# Patient Record
Sex: Female | Born: 1980 | Race: White | Hispanic: Yes | Marital: Married | Smoking: Never smoker
Health system: Southern US, Community
[De-identification: ages and names within clinical notes are randomized; demographics above are authoritative.]

## PROBLEM LIST (undated history)

## (undated) ENCOUNTER — Inpatient Hospital Stay (HOSPITAL_COMMUNITY): Payer: Self-pay

## (undated) DIAGNOSIS — O209 Hemorrhage in early pregnancy, unspecified: Secondary | ICD-10-CM

## (undated) DIAGNOSIS — O4292 Full-term premature rupture of membranes, unspecified as to length of time between rupture and onset of labor: Secondary | ICD-10-CM

## (undated) DIAGNOSIS — D62 Acute posthemorrhagic anemia: Secondary | ICD-10-CM

## (undated) DIAGNOSIS — R011 Cardiac murmur, unspecified: Secondary | ICD-10-CM

## (undated) DIAGNOSIS — J329 Chronic sinusitis, unspecified: Secondary | ICD-10-CM

## (undated) HISTORY — PX: OTHER SURGICAL HISTORY: SHX169

## (undated) HISTORY — PX: WISDOM TOOTH EXTRACTION: SHX21

## (undated) HISTORY — PX: MOUTH SURGERY: SHX715

## (undated) HISTORY — PX: NASAL POLYP SURGERY: SHX186

---

## 2014-08-04 ENCOUNTER — Inpatient Hospital Stay (HOSPITAL_COMMUNITY): Payer: Managed Care, Other (non HMO)

## 2014-08-04 ENCOUNTER — Inpatient Hospital Stay (HOSPITAL_COMMUNITY)
Admission: AD | Admit: 2014-08-04 | Discharge: 2014-08-04 | Disposition: A | Discharge status: Home / Self Care | Payer: Managed Care, Other (non HMO) | Source: Ambulatory Visit | Attending: Obstetrics & Gynecology | Admitting: Obstetrics & Gynecology

## 2014-08-04 ENCOUNTER — Encounter (HOSPITAL_COMMUNITY): Payer: Self-pay | Admitting: *Deleted

## 2014-08-04 DIAGNOSIS — O039 Complete or unspecified spontaneous abortion without complication: Secondary | ICD-10-CM | POA: Diagnosis not present

## 2014-08-04 DIAGNOSIS — R109 Unspecified abdominal pain: Secondary | ICD-10-CM | POA: Insufficient documentation

## 2014-08-04 DIAGNOSIS — O209 Hemorrhage in early pregnancy, unspecified: Secondary | ICD-10-CM | POA: Diagnosis present

## 2014-08-04 HISTORY — DX: Chronic sinusitis, unspecified: J32.9

## 2014-08-04 LAB — CBC
HCT: 37.7 % (ref 36.0–46.0)
Hemoglobin: 13.4 g/dL (ref 12.0–15.0)
MCH: 33.3 pg (ref 26.0–34.0)
MCHC: 35.5 g/dL (ref 30.0–36.0)
MCV: 93.8 fL (ref 78.0–100.0)
PLATELETS: 191 10*3/uL (ref 150–400)
RBC: 4.02 MIL/uL (ref 3.87–5.11)
RDW: 12.5 % (ref 11.5–15.5)
WBC: 9.9 10*3/uL (ref 4.0–10.5)

## 2014-08-04 LAB — URINALYSIS, ROUTINE W REFLEX MICROSCOPIC
BILIRUBIN URINE: NEGATIVE
Glucose, UA: NEGATIVE mg/dL
Ketones, ur: NEGATIVE mg/dL
Leukocytes, UA: NEGATIVE
NITRITE: NEGATIVE
Protein, ur: NEGATIVE mg/dL
SPECIFIC GRAVITY, URINE: 1.01 (ref 1.005–1.030)
UROBILINOGEN UA: 0.2 mg/dL (ref 0.0–1.0)
pH: 7.5 (ref 5.0–8.0)

## 2014-08-04 LAB — URINE MICROSCOPIC-ADD ON

## 2014-08-04 LAB — WET PREP, GENITAL
Clue Cells Wet Prep HPF POC: NONE SEEN
Trich, Wet Prep: NONE SEEN
Yeast Wet Prep HPF POC: NONE SEEN

## 2014-08-04 LAB — POCT PREGNANCY, URINE: Preg Test, Ur: POSITIVE — AB

## 2014-08-04 LAB — HCG, QUANTITATIVE, PREGNANCY: HCG, BETA CHAIN, QUANT, S: 13 m[IU]/mL — AB (ref <5)

## 2014-08-04 LAB — ABO/RH: ABO/RH(D): O POS

## 2014-08-04 MED ORDER — METOCLOPRAMIDE HCL 10 MG PO TABS
10.0000 mg | ORAL_TABLET | ORAL | Status: DC
  Filled 2014-08-04: qty 1

## 2014-08-04 MED ORDER — OXYCODONE-ACETAMINOPHEN 5-325 MG PO TABS
1.0000 | ORAL_TABLET | ORAL | Status: AC
  Administered 2014-08-04: 1 via ORAL
  Filled 2014-08-04: qty 1

## 2014-08-04 MED ORDER — OXYCODONE-ACETAMINOPHEN 5-325 MG PO TABS: 1.0000 | ORAL_TABLET | Freq: Four times a day (QID) | ORAL | Status: DC | PRN

## 2014-08-04 NOTE — MAU Provider Note (Signed)
History     CSN: [id number]  Arrival date and time: 08/04/14 [id number]   First Provider Initiated Contact with Patient 08/04/14 [phone]      Chief Complaint  Patient presents with  . Vaginal Bleeding   HPI Ann Henry 33 y.o.G1P0 @[redacted]w[redacted]d  presents to MAU with cramping and bleeding that began at 7am.  She may have had some bleeding or discharge with cramping during the night.  She had just taken a positive home pregnancy test 3 days ago.  She has some nausea, cramping pain 7-10/10, diarrhea.  She denies vomiting, dysuria, fever.   OB History   Grav Para Term Preterm Abortions TAB SAB Ect Mult Living   1         0      Past Medical History  Diagnosis Date  . Sinus infection     Past Surgical History  Procedure Laterality Date  . Wisdom tooth extraction    . Dental implant      History reviewed. No pertinent family history.  History  Substance Use Topics  . Smoking status: Never Smoker   . Smokeless tobacco: Never Used  . Alcohol Use: Yes     Comment: Occas.    Allergies: No Known Allergies  No prescriptions prior to admission    Review of Systems  Constitutional: Negative for fever, chills and diaphoresis.  HENT: Negative for congestion and sore throat.   Eyes: Negative for blurred vision and double vision.  Respiratory: Negative for cough, shortness of breath and wheezing.   Cardiovascular: Negative for chest pain and palpitations.  Gastrointestinal: Positive for nausea, abdominal pain and diarrhea. Negative for heartburn, vomiting and constipation.  Genitourinary: Negative for dysuria, urgency and frequency.  Skin: Negative for itching and rash.  Neurological: Positive for dizziness and headaches. Negative for weakness.   Physical Exam   Blood pressure 125/74, pulse 71, temperature 98.1 F (36.7 C), temperature source Oral, resp. rate 18, height 5\' 7"  (1.702 m), weight 60.782 kg (134 lb), last menstrual period 07/01/2014.  Physical Exam  Constitutional: She is  oriented to person, place, and time. She appears well-developed and well-nourished. No distress.  HENT:  Head: Normocephalic and atraumatic.  Eyes: EOM are normal.  Neck: Normal range of motion.  Cardiovascular: Normal rate and regular rhythm.   Respiratory: Effort normal and breath sounds normal. No respiratory distress.  GI: Soft. Bowel sounds are normal. She exhibits no distension. There is no tenderness.  Genitourinary:  Vaginal vault full of bright red blood (despite no bleeding on pad).  Very small clots noted.   Cervix closed Adnexa nontender No CMT  Musculoskeletal: Normal range of motion. She exhibits no edema.  Neurological: She is alert and oriented to person, place, and time.  Skin: Skin is warm and dry.  Psychiatric: She has a normal mood and affect.   Results for orders placed during the hospital encounter of 08/04/14 (from the past 24 hour(s))  URINALYSIS, ROUTINE W REFLEX MICROSCOPIC     Status: Abnormal   Collection Time    08/04/14  9:22 AM      Result Value Ref Range   Color, Urine YELLOW  YELLOW   APPearance CLEAR  CLEAR   Specific Gravity, Urine 1.010  1.005 - 1.030   pH 7.5  5.0 - 8.0   Glucose, UA NEGATIVE  NEGATIVE mg/dL   Hgb urine dipstick MODERATE (*) NEGATIVE   Bilirubin Urine NEGATIVE  NEGATIVE   Ketones, ur NEGATIVE  NEGATIVE mg/dL   Protein,  ur NEGATIVE  NEGATIVE mg/dL   Urobilinogen, UA 0.2  0.0 - 1.0 mg/dL   Nitrite NEGATIVE  NEGATIVE   Leukocytes, UA NEGATIVE  NEGATIVE  URINE MICROSCOPIC-ADD ON     Status: Abnormal   Collection Time    08/04/14  9:22 AM      Result Value Ref Range   Squamous Epithelial / LPF FEW (*) RARE   RBC / HPF 3-6  <3 RBC/hpf   Bacteria, UA FEW (*) RARE   Urine-Other MUCOUS PRESENT    POCT PREGNANCY, URINE     Status: Abnormal   Collection Time    08/04/14  9:37 AM      Result Value Ref Range   Preg Test, Ur POSITIVE (*) NEGATIVE  CBC     Status: None   Collection Time    08/04/14 10:05 AM      Result Value  Ref Range   WBC 9.9  4.0 - 10.5 K/uL   RBC 4.02  3.87 - 5.11 MIL/uL   Hemoglobin 13.4  12.0 - 15.0 g/dL   HCT 81.1  91.4 - 78.2 %   MCV 93.8  78.0 - 100.0 fL   MCH 33.3  26.0 - 34.0 pg   MCHC 35.5  30.0 - 36.0 g/dL   RDW 95.6  21.3 - 08.6 %   Platelets 191  150 - 400 K/uL  ABO/RH     Status: None   Collection Time    08/04/14 10:05 AM      Result Value Ref Range   ABO/RH(D) O POS    HCG, QUANTITATIVE, PREGNANCY     Status: Abnormal   Collection Time    08/04/14 10:05 AM      Result Value Ref Range   hCG, Beta Chain, Quant, S 13 (*) <5 mIU/mL  WET PREP, GENITAL     Status: Abnormal   Collection Time    08/04/14 10:36 AM      Result Value Ref Range   Yeast Wet Prep HPF POC NONE SEEN  NONE SEEN   Trich, Wet Prep NONE SEEN  NONE SEEN   Clue Cells Wet Prep HPF POC NONE SEEN  NONE SEEN   WBC, Wet Prep HPF POC FEW (*) NONE SEEN    MAU Course  Procedures none  MDM U/S neg for IUP: likely recent SAB  Assessment and Plan  Assessment: SAB  Plan: Discharge to home Follow up Quant in clinic in 48 hours Return to MAU for emergencies: increased pain or bleeding Percocet for pain control Nausea medication declined  Bertram Denver 08/04/2014, 9:50 AM

## 2014-08-04 NOTE — Discharge Instructions (Signed)
Miscarriage A miscarriage is the sudden loss of an unborn baby (fetus) before the 20th week of pregnancy. Most miscarriages happen in the first 3 months of pregnancy. Sometimes, it happens before a woman even knows she is pregnant. A miscarriage is also called a "spontaneous miscarriage" or "early pregnancy loss." Having a miscarriage can be an emotional experience. Talk with your caregiver about any questions you may have about miscarrying, the grieving process, and your future pregnancy plans. CAUSES   Problems with the fetal chromosomes that make it impossible for the baby to develop normally. Problems with the baby's genes or chromosomes are most often the result of errors that occur, by chance, as the embryo divides and grows. The problems are not inherited from the parents.  Infection of the cervix or uterus.   Hormone problems.   Problems with the cervix, such as having an incompetent cervix. This is when the tissue in the cervix is not strong enough to hold the pregnancy.   Problems with the uterus, such as an abnormally shaped uterus, uterine fibroids, or congenital abnormalities.   Certain medical conditions.   Smoking, drinking alcohol, or taking illegal drugs.   Trauma.  Often, the cause of a miscarriage is unknown.  SYMPTOMS   Vaginal bleeding or spotting, with or without cramps or pain.  Pain or cramping in the abdomen or lower back.  Passing fluid, tissue, or blood clots from the vagina. DIAGNOSIS  Your caregiver will perform a physical exam. You may also have an ultrasound to confirm the miscarriage. Blood or urine tests may also be ordered. TREATMENT   Sometimes, treatment is not necessary if you naturally pass all the fetal tissue that was in the uterus. If some of the fetus or placenta remains in the body (incomplete miscarriage), tissue left behind may become infected and must be removed. Usually, a dilation and curettage (D and C) procedure is performed.  During a D and C procedure, the cervix is widened (dilated) and any remaining fetal or placental tissue is gently removed from the uterus.  Antibiotic medicines are prescribed if there is an infection. Other medicines may be given to reduce the size of the uterus (contract) if there is a lot of bleeding.  If you have Rh negative blood and your baby was Rh positive, you will need a Rh immunoglobulin shot. This shot will protect any future baby from having Rh blood problems in future pregnancies. HOME CARE INSTRUCTIONS   Your caregiver may order bed rest or may allow you to continue light activity. Resume activity as directed by your caregiver.  Have someone help with home and family responsibilities during this time.   Keep track of the number of sanitary pads you use each day and how soaked (saturated) they are. Write down this information.   Do not use tampons. Do not douche or have sexual intercourse until approved by your caregiver.   Only take over-the-counter or prescription medicines for pain or discomfort as directed by your caregiver.   Do not take aspirin. Aspirin can cause bleeding.   Keep all follow-up appointments with your caregiver.   If you or your partner have problems with grieving, talk to your caregiver or seek counseling to help cope with the pregnancy loss. Allow enough time to grieve before trying to get pregnant again.  SEEK IMMEDIATE MEDICAL CARE IF:   You have severe cramps or pain in your back or abdomen.  You have a fever.  You pass large blood clots (walnut-sized   or larger) ortissue from your vagina. Save any tissue for your caregiver to inspect.   Your bleeding increases.   You have a thick, bad-smelling vaginal discharge.  You become lightheaded, weak, or you faint.   You have chills.  MAKE SURE YOU:  Understand these instructions.  Will watch your condition.  Will get help right away if you are not doing well or get  worse. Document Released: 05/30/2001 Document Revised: 03/31/2013 Document Reviewed: 01/23/2012 ExitCare Patient Information 2015 [employer]. This information is not intended to replace advice given to you by your health care provider. Make sure you discuss any questions you have with your health care provider.  

## 2014-08-04 NOTE — Progress Notes (Signed)
Was feeling nauseated. Asked PA for order for medication. Patient declines at this time. States she is feeling better right now. [place] given

## 2014-08-04 NOTE — MAU Note (Signed)
Had + HPT this weekend. Had some spotting during the night. Started cramping and bleeding became heavier. Scant amount blood noted on perineum right now, but no active bleeding.

## 2014-08-05 LAB — GC/CHLAMYDIA PROBE AMP
CT Probe RNA: NEGATIVE
GC Probe RNA: NEGATIVE

## 2014-08-06 ENCOUNTER — Inpatient Hospital Stay (HOSPITAL_COMMUNITY)
Admission: AD | Admit: 2014-08-06 | Discharge: 2014-08-06 | Disposition: A | Discharge status: Home / Self Care | Payer: Managed Care, Other (non HMO) | Source: Ambulatory Visit | Attending: Family Medicine | Admitting: Family Medicine

## 2014-08-06 DIAGNOSIS — O039 Complete or unspecified spontaneous abortion without complication: Secondary | ICD-10-CM | POA: Insufficient documentation

## 2014-08-06 LAB — HCG, QUANTITATIVE, PREGNANCY: HCG, BETA CHAIN, QUANT, S: 4 m[IU]/mL (ref <5)

## 2014-08-06 NOTE — MAU Note (Signed)
Feeling better. Is still bleeding, like a period. Pain is better

## 2014-08-06 NOTE — MAU Provider Note (Signed)
  History     CSN: [id number]  Arrival date and time: 08/06/14 [id number]   First Provider Initiated Contact with Patient 08/06/14 [phone]      Chief Complaint  Patient presents with  . Follow-up   HPI Comments: Ann Henry 33 y.o. G1P0 2972w1d presents to MAU for follow up with [facility]. She is bleeding like she is on her menses and has no pain. She is planning another pregnancy soon and would like to be seen in [place].     Past Medical History  Diagnosis Date  . Sinus infection     Past Surgical History  Procedure Laterality Date  . Wisdom tooth extraction    . Dental implant      No family history on file.  History  Substance Use Topics  . Smoking status: Never Smoker   . Smokeless tobacco: Never Used  . Alcohol Use: Yes     Comment: Occas.    Allergies: No Known Allergies  No prescriptions prior to admission    Review of Systems  Constitutional: Negative.   HENT: Negative.   Eyes: Negative.   Cardiovascular: Negative.   Genitourinary: Negative.        Vaginal bleeding  Musculoskeletal: Negative.   Skin: Negative.   Neurological: Negative.   Psychiatric/Behavioral: Negative.    Physical Exam   Blood pressure 118/77, pulse 63, temperature 98.5 F (36.9 C), temperature source Oral, resp. rate 16, last menstrual period 07/01/2014.  Physical Exam  Constitutional: She is oriented to person, place, and time. She appears well-developed and well-nourished.  HENT:  Head: Normocephalic and atraumatic.  Eyes: Pupils are equal, round, and reactive to light.  Musculoskeletal: Normal range of motion.  Neurological: She is alert and oriented to person, place, and time.  Skin: Skin is warm.  Psychiatric: She has a normal mood and affect. Her behavior is normal. Judgment and thought content normal.    MAU Course  Procedures  MDM  Lab Results  Component Value Date   HCGBETAQNT 4 08/06/2014   HCGBETAQNT 13* 08/04/2014      Assessment and Plan   A: SAB  P:  Advised to avoid pregnancy for 3 cycles Given list of providers in [place] Follow up as needed  Carolynn ServeBarefoot, Nilah Belcourt Miller 08/06/2014, 3:01 PM

## 2014-08-06 NOTE — Discharge Instructions (Signed)
Miscarriage °A miscarriage is the loss of an unborn baby (fetus) before the 20th week of pregnancy. The cause is often unknown.  °HOME CARE °· You may need to stay in bed (bed rest), or you may be able to do light activity. Go about activity as told by your doctor. °· Have help at home. °· Write down how many pads you use each day. Write down how soaked they are. °· Do not use tampons. Do not wash out your vagina (douche) or have sex (intercourse) until your doctor approves. °· Only take medicine as told by your doctor. °· Do not take aspirin. °· Keep all doctor visits as told. °· If you or your partner have problems with grieving, talk to your doctor. You can also try counseling. Give yourself time to grieve before trying to get pregnant again. °GET HELP RIGHT AWAY IF: °· You have bad cramps or pain in your back or belly (abdomen). °· You have a fever. °· You pass large clumps of blood (clots) from your vagina that are walnut-sized or larger. Save the clumps for your doctor to see. °· You pass large amounts of tissue from your vagina. Save the tissue for your doctor to see. °· You have more bleeding. °· You have thick, bad-smelling fluid (discharge) coming from the vagina. °· You get lightheaded, weak, or you pass out (faint). °· You have chills. °MAKE SURE YOU: °· Understand these instructions. °· Will watch your condition. °· Will get help right away if you are not doing well or get worse. °Document Released: 02/26/2012 Document Reviewed: 02/26/2012 °ExitCare® Patient Information ©2015 [employer]. This information is not intended to replace advice given to you by your health care provider. Make sure you discuss any questions you have with your health care provider. ° °

## 2014-08-06 NOTE — MAU Provider Note (Signed)
Attestation of Attending Supervision of Advanced Practitioner (PA/CNM/NP): Evaluation and management procedures were performed by the Advanced Practitioner under my supervision and collaboration.  I have reviewed the Advanced Practitioner's note and chart, and I agree with the management and plan.  Reva BoresPRATT,Caidence Kaseman S, MD [facility] Attending 08/06/2014 4:25 PM

## 2014-10-20 ENCOUNTER — Encounter (HOSPITAL_COMMUNITY): Payer: Self-pay | Admitting: *Deleted

## 2014-12-18 NOTE — L&D Delivery Note (Signed)
Delivery Note  First Stage: Labor onset: 2300 (9/30) Augmentation: Pitocin Analgesia Eliezer Lofts intrapartum: epidural, IV Fentanyl SROM at 2300 (9/30)  Second Stage: Complete dilation at 0624 Onset of pushing at 0741 FHR second stage 135, variables with pushing then 90s x3 min, maternal lateral and o2  In maternal lithotomy, delivery of a viable female at 30 by CNM in OA position with restitution to ROT and compound left arm Tight nuchal cord x2, clamped and cut on perineum after delivery of anterior shoulder Cord double clamped after cessation of pulsation, cut by CNM Cord blood sample collected   Collection of cord blood donation n/a Arterial cord blood sample n/a  Third Stage: Placenta delivered via Tomasa Blase intact with 3 VC @ [id number] Placenta disposition: routine disposal Uterine tone firm / bleeding small  Bilateral labial minora laceration and 1st degree perineal laceration identified  Anesthesia for repair: epidural Repaired with 3-0 Vicryl rapide Est. Blood Loss (mL): 447  Complications: Prolonged ROM  Mom to postpartum.  Baby to Couplet care / Skin to Skin.  Newborn: Birth Weight: pending Apgar Scores: 8/9 Feeding planned: breast  Donette Larry, N MSN, CNM 09/19/2015, 9:58 AM

## 2015-01-30 ENCOUNTER — Inpatient Hospital Stay (HOSPITAL_COMMUNITY): Payer: Managed Care, Other (non HMO)

## 2015-01-30 ENCOUNTER — Inpatient Hospital Stay (HOSPITAL_COMMUNITY)
Admission: AD | Admit: 2015-01-30 | Discharge: 2015-01-30 | Disposition: A | Discharge status: Home / Self Care | Payer: Managed Care, Other (non HMO) | Source: Ambulatory Visit | Attending: Obstetrics and Gynecology | Admitting: Obstetrics and Gynecology

## 2015-01-30 ENCOUNTER — Encounter (HOSPITAL_COMMUNITY): Payer: Self-pay

## 2015-01-30 DIAGNOSIS — O200 Threatened abortion: Secondary | ICD-10-CM | POA: Diagnosis not present

## 2015-01-30 DIAGNOSIS — Z3A08 8 weeks gestation of pregnancy: Secondary | ICD-10-CM | POA: Insufficient documentation

## 2015-01-30 DIAGNOSIS — O418X10 Other specified disorders of amniotic fluid and membranes, first trimester, not applicable or unspecified: Secondary | ICD-10-CM

## 2015-01-30 DIAGNOSIS — O209 Hemorrhage in early pregnancy, unspecified: Secondary | ICD-10-CM | POA: Diagnosis present

## 2015-01-30 DIAGNOSIS — O468X1 Other antepartum hemorrhage, first trimester: Secondary | ICD-10-CM

## 2015-01-30 DIAGNOSIS — O208 Other hemorrhage in early pregnancy: Secondary | ICD-10-CM | POA: Insufficient documentation

## 2015-01-30 HISTORY — DX: Hemorrhage in early pregnancy, unspecified: O20.9

## 2015-01-30 LAB — URINALYSIS, ROUTINE W REFLEX MICROSCOPIC
Bilirubin Urine: NEGATIVE
Glucose, UA: NEGATIVE mg/dL
Ketones, ur: NEGATIVE mg/dL
LEUKOCYTES UA: NEGATIVE
Nitrite: NEGATIVE
PH: 6 (ref 5.0–8.0)
Protein, ur: NEGATIVE mg/dL
SPECIFIC GRAVITY, URINE: 1.02 (ref 1.005–1.030)
UROBILINOGEN UA: 0.2 mg/dL (ref 0.0–1.0)

## 2015-01-30 LAB — URINE MICROSCOPIC-ADD ON

## 2015-01-30 LAB — CBC
HCT: 33.9 % — ABNORMAL LOW (ref 36.0–46.0)
Hemoglobin: 12 g/dL (ref 12.0–15.0)
MCH: 32.7 pg (ref 26.0–34.0)
MCHC: 35.4 g/dL (ref 30.0–36.0)
MCV: 92.4 fL (ref 78.0–100.0)
Platelets: 197 10*3/uL (ref 150–400)
RBC: 3.67 MIL/uL — AB (ref 3.87–5.11)
RDW: 12 % (ref 11.5–15.5)
WBC: 10.2 10*3/uL (ref 4.0–10.5)

## 2015-01-30 NOTE — MAU Provider Note (Signed)
History     CSN: [id number]  Arrival date and time: 01/30/15 0055  Provider notified: 0135 & 0245 Provider on unit: 0325 Provider at bedside: 0330    Chief Complaint  Patient presents with  . Vaginal Bleeding   HPI  Ms. Ann Henry is a 34 yo G2P0010 at 8.[redacted] wks gestation presenting with complaints of sudden onset of vaginal bleeding like a period about 250030. Denies any clots. Last sex was last Wednesday.Her blood type is O positive.  Her hx is significant for a MAB 07/2014 - currently taking progesterone vaginally.  Her primary OB provider at [facility] is Dr. Billy Coastaavon.  Past Medical History  Diagnosis Date  . Sinus infection   . Vaginal bleeding in pregnant patient at less than [redacted] weeks gestation 01/30/2015    Past Surgical History  Procedure Laterality Date  . Wisdom tooth extraction    . Dental implant    . Nasal polyp surgery    . Mouth surgery      Family History  Problem Relation Age of Onset  . Hypertension Mother   . Heart disease Father     History  Substance Use Topics  . Smoking status: Never Smoker   . Smokeless tobacco: Never Used  . Alcohol Use: Yes     Comment: Occas. social before preg    Allergies: No Known Allergies  Prescriptions prior to admission  Medication Sig Dispense Refill Last Dose  . Prenatal Vit-Fe Fumarate-FA (PRENATAL MULTIVITAMIN) TABS tablet Take 1 tablet by mouth daily at 12 noon.   01/29/2015 at Unknown time  . PRESCRIPTION MEDICATION PROGESTERONE VAGINALLY   Past Week at Unknown time  . ibuprofen (ADVIL,MOTRIN) 200 MG tablet Take 400 mg by mouth daily as needed for mild pain or moderate pain.   Past Month at Unknown time  . oxyCODONE-acetaminophen (ROXICET) 5-325 MG per tablet Take 1 tablet by mouth every 6 (six) hours as needed for severe pain. 15 tablet 0     Review of Systems  Constitutional: Negative.   HENT: Negative.   Eyes: Negative.   Respiratory: Negative.   Cardiovascular: Negative.   Gastrointestinal: Negative.    Genitourinary:       Vaginal bleeding like a period, but only with wiping in BR; no pain  Musculoskeletal: Negative.   Skin: Negative.   Neurological: Negative.   Endo/Heme/Allergies: Negative.   Psychiatric/Behavioral: Negative.    US Ob Comp Less 14 Wks  01/30/2015   CLINICAL DATA:  Vaginal bleeding in pregnancy, first trimester. Initial encounter.  EXAM: OBSTETRIC <14 WK US AND TRANSVAGINAL OB US  TECHNIQUE: Both transabdominal and transvaginal ultrasound examinations were performed for complete evaluation of the gestation as well as the maternal uterus, adnexal regions, and pelvic cul-de-sac. Transvaginal technique was performed to assess early pregnancy.  COMPARISON:  None.  FINDINGS: Intrauterine gestational sac: Visualized/normal in shape.  Yolk sac:  Visible  Embryo:  Visible  Cardiac Activity: detected  Heart Rate: 170 bpm  CRL:  2.0   mm   8  w   5  d  Maternal uterus/adnexae: Small volume subchorionic hemorrhage seen in the upper endometrium. No pelvic free fluid. Both ovaries appear normal, seen transabdominally.  IMPRESSION: 1.  Single living IUP demonstrated.  2.  Small volume subchorionic hemorrhage.  No pelvic free fluid.   Electronically Signed   By: Odessa FlemingH  Hall M.D.   On: 01/30/2015 02:39    Physical Exam   Blood pressure 114/72, pulse 58, temperature 97.7 F (36.5 C), temperature  source Oral, resp. rate 16, height  (1.676 m), weight 62.37 kg (137 lb 8 oz), last menstrual period 11/30/2014, SpO2 99 %, unknown if currently breastfeeding.  Physical Exam  Constitutional: She is oriented to person, place, and time. She appears well-developed and well-nourished.  Eyes: Pupils are equal, round, and reactive to light.  Neck: Normal range of motion.  Cardiovascular: Normal rate, regular rhythm, normal heart sounds and intact distal pulses.   Respiratory: Effort normal and breath sounds normal.  GI: Soft. Bowel sounds are normal.  Genitourinary: Uterus normal. Vaginal discharge  found.  Scant amount of brown vaginal discharge c/w old blood; gravid uterus; S=D  Musculoskeletal: Normal range of motion.  Neurological: She is alert and oriented to person, place, and time. She has normal reflexes.  Skin: Skin is warm and dry.  Psychiatric: She has a normal mood and affect. Her behavior is normal. Judgment and thought content normal.    MAU Course  Procedures CBC OB U/S <14 wks Pelvic Exam Assessment and Plan  SIUP @ 8.[redacted] wks gestation Subchorionic Hemorrhage Threatened Miscarriage  Discharge Home Bleeding Precautions Reviewed Threatened Miscarriage Precautions given Use Progesterone as previously prescribed / insert tonight's dose before bed tonight Pelvic Rest until NOB appt  Keep scheduled appt on 2/16 Call the office for any further questions, problems or concerns / call with worsening sx's  Ann Henry, Judie Petit MSN, CNM 01/30/2015, 3:57 AM

## 2015-01-30 NOTE — Discharge Instructions (Signed)
Pelvic Rest Pelvic rest is sometimes recommended for women when:   The placenta is partially or completely covering the opening of the cervix (placenta previa).  There is bleeding between the uterine wall and the amniotic sac in the first trimester (subchorionic hemorrhage).  The cervix begins to open without labor starting (incompetent cervix, cervical insufficiency).  The labor is too early (preterm labor). HOME CARE INSTRUCTIONS  Do not have sexual intercourse, stimulation, or an orgasm.  Do not use tampons, douche, or put anything in the vagina.  Do not lift anything over 10 pounds (4.5 kg).  Avoid strenuous activity or straining your pelvic muscles. SEEK MEDICAL CARE IF:  You have any vaginal bleeding during pregnancy. Treat this as a potential emergency.  You have cramping pain felt low in the stomach (stronger than menstrual cramps).  You notice vaginal discharge (watery, mucus, or bloody).  You have a low, dull backache.  There are regular contractions or uterine tightening. SEEK IMMEDIATE MEDICAL CARE IF: You have vaginal bleeding and have placenta previa.  Document Released: 03/31/2011 Document Revised: 02/26/2012 Document Reviewed: 03/31/2011 [facility] Patient Information 2015 [place], [place]. This information is not intended to replace advice given to you by your health care provider. Make sure you discuss any questions you have with your health care provider.  Vaginal Bleeding During Pregnancy, First Trimester A small amount of bleeding (spotting) from the vagina is relatively common in early pregnancy. It usually stops on its own. Various things may cause bleeding or spotting in early pregnancy. Some bleeding may be related to the pregnancy, and some may not. In most cases, the bleeding is normal and is not a problem. However, bleeding can also be a sign of something serious. Be sure to tell your health care provider about any vaginal bleeding right away. Some  possible causes of vaginal bleeding during the first trimester include:  Infection or inflammation of the cervix.  Growths (polyps) on the cervix.  Miscarriage or threatened miscarriage.  Pregnancy tissue has developed outside of the uterus and in a fallopian tube (tubal pregnancy).  Tiny cysts have developed in the uterus instead of pregnancy tissue (molar pregnancy). HOME CARE INSTRUCTIONS  Watch your condition for any changes. The following actions may help to lessen any discomfort you are feeling:  Follow your health care provider's instructions for limiting your activity. If your health care provider orders bed rest, you may need to stay in bed and only get up to use the bathroom. However, your health care provider may allow you to continue light activity.  If needed, make plans for someone to help with your regular activities and responsibilities while you are on bed rest.  Keep track of the number of pads you use each day, how often you change pads, and how soaked (saturated) they are. Write this down.  Do not use tampons. Do not douche.  Do not have sexual intercourse or orgasms until approved by your health care provider.  If you pass any tissue from your vagina, save the tissue so you can show it to your health care provider.  Only take over-the-counter or prescription medicines as directed by your health care provider.  Do not take aspirin because it can make you bleed.  Keep all follow-up appointments as directed by your health care provider. SEEK MEDICAL CARE IF:  You have any vaginal bleeding during any part of your pregnancy.  You have cramps or labor pains.  You have a fever, not controlled by medicine. SEEK IMMEDIATE  MEDICAL CARE IF:   You have severe cramps in your back or belly (abdomen).  You pass large clots or tissue from your vagina.  Your bleeding increases.  You feel light-headed or weak, or you have fainting episodes.  You have chills.  You  are leaking fluid or have a gush of fluid from your vagina.  You pass out while having a bowel movement. MAKE SURE YOU:  Understand these instructions.  Will watch your condition.  Will get help right away if you are not doing well or get worse. Document Released: 09/13/2005 Document Revised: 12/09/2013 Document Reviewed: 08/11/2013 [facility] Patient Information 2015 [place], [place]. This information is not intended to replace advice given to you by your health care provider. Make sure you discuss any questions you have with your health care provider. Subchorionic Hematoma A subchorionic hematoma is a gathering of blood between the outer wall of the placenta and the inner wall of the womb (uterus). The placenta is the organ that connects the fetus to the wall of the uterus. The placenta performs the feeding, breathing (oxygen to the fetus), and waste removal (excretory work) of the fetus.  Subchorionic hematoma is the most common abnormality found on a result from ultrasonography done during the first trimester or early second trimester of pregnancy. If there has been little or no vaginal bleeding, early small hematomas usually shrink on their own and do not affect your baby or pregnancy. The blood is gradually absorbed over 1-2 weeks. When bleeding starts later in pregnancy or the hematoma is larger or occurs in an older pregnant woman, the outcome may not be as good. Larger hematomas may get bigger, which increases the chances for miscarriage. Subchorionic hematoma also increases the risk of premature detachment of the placenta from the uterus, preterm (premature) labor, and stillbirth. HOME CARE INSTRUCTIONS  Stay on bed rest if your health care provider recommends this. Although bed rest will not prevent more bleeding or prevent a miscarriage, your health care provider may recommend bed rest until you are advised otherwise.  Avoid heavy lifting (more than 10 lb [4.5 kg]), exercise, sexual  intercourse, or douching as directed by your health care provider.  Keep track of the number of pads you use each day and how soaked (saturated) they are. Write down this information.  Do not use tampons.  Keep all follow-up appointments as directed by your health care provider. Your health care provider may ask you to have follow-up blood tests or ultrasound tests or both. SEEK IMMEDIATE MEDICAL CARE IF:  You have severe cramps in your stomach, back, abdomen, or pelvis.  You have a fever.  You pass large clots or tissue. Save any tissue for your health care provider to look at.  Your bleeding increases or you become lightheaded, feel weak, or have fainting episodes. Document Released: 03/21/2007 Document Revised: 04/20/2014 Document Reviewed: 07/03/2013 [facility] Patient Information 2015 [place], [place]. This information is not intended to replace advice given to you by your health care provider. Make sure you discuss any questions you have with your health care provider. Threatened Miscarriage A threatened miscarriage occurs when you have vaginal bleeding during your first 20 weeks of pregnancy but the pregnancy has not ended. If you have vaginal bleeding during this time, your health care provider will do tests to make sure you are still pregnant. If the tests show you are still pregnant and the developing baby (fetus) inside your womb (uterus) is still growing, your condition is considered a threatened  miscarriage. A threatened miscarriage does not mean your pregnancy will end, but it does increase the risk of losing your pregnancy (complete miscarriage). CAUSES  The cause of a threatened miscarriage is usually not known. If you go on to have a complete miscarriage, the most common cause is an abnormal number of chromosomes in the developing baby. Chromosomes are the structures inside cells that hold all your genetic material. Some causes of vaginal bleeding that do not result in  miscarriage include:  Having sex.  Having an infection.  Normal hormone changes of pregnancy.  Bleeding that occurs when an egg implants in your uterus. RISK FACTORS Risk factors for bleeding in early pregnancy include:  Obesity.  Smoking.  Drinking excessive amounts of alcohol or caffeine.  Recreational drug use. SIGNS AND SYMPTOMS  Light vaginal bleeding.  Mild abdominal pain or cramps. DIAGNOSIS  If you have bleeding with or without abdominal pain before 20 weeks of pregnancy, your health care provider will do tests to check whether you are still pregnant. One important test involves using sound waves and a computer (ultrasound) to create images of the inside of your uterus. Other tests include an internal exam of your vagina and uterus (pelvic exam) and measurement of your baby's heart rate.  You may be diagnosed with a threatened miscarriage if:  Ultrasound testing shows you are still pregnant.  Your baby's heart rate is strong.  A pelvic exam shows that the opening between your uterus and your vagina (cervix) is closed.  Your heart rate and blood pressure are stable.  Blood tests confirm you are still pregnant. TREATMENT  No treatments have been shown to prevent a threatened miscarriage from going on to a complete miscarriage. However, the right home care is important.  HOME CARE INSTRUCTIONS   Make sure you keep all your appointments for prenatal care. This is very important.  Get plenty of rest.  Do not have sex or use tampons if you have vaginal bleeding.  Do not douche.  Do not smoke or use recreational drugs.  Do not drink alcohol.  Avoid caffeine. SEEK MEDICAL CARE IF:  You have light vaginal bleeding or spotting while pregnant.  You have abdominal pain or cramping.  You have a fever. SEEK IMMEDIATE MEDICAL CARE IF:  You have heavy vaginal bleeding.  You have blood clots coming from your vagina.  You have severe low back pain or abdominal  cramps.  You have fever, chills, and severe abdominal pain. MAKE SURE YOU:  Understand these instructions.  Will watch your condition.  Will get help right away if you are not doing well or get worse. Document Released: 12/04/2005 Document Revised: 12/09/2013 Document Reviewed: 09/30/2013 [facility] Patient Information 2015 [place], [place]. This information is not intended to replace advice given to you by your health care provider. Make sure you discuss any questions you have with your health care provider.

## 2015-01-30 NOTE — MAU Note (Signed)
Reports hx of miscarriage last year and tonight started having vag bleeding like a period about 30 min ago.  Denies any clots.  Last sex was last Wednesday.

## 2015-02-02 LAB — OB RESULTS CONSOLE ABO/RH: RH Type: POSITIVE

## 2015-02-02 LAB — OB RESULTS CONSOLE HIV ANTIBODY (ROUTINE TESTING): HIV: NONREACTIVE

## 2015-02-02 LAB — OB RESULTS CONSOLE RPR: RPR: NONREACTIVE

## 2015-02-02 LAB — OB RESULTS CONSOLE ANTIBODY SCREEN: ANTIBODY SCREEN: NEGATIVE

## 2015-02-02 LAB — OB RESULTS CONSOLE RUBELLA ANTIBODY, IGM: Rubella: IMMUNE

## 2015-02-02 LAB — OB RESULTS CONSOLE HEPATITIS B SURFACE ANTIGEN: Hepatitis B Surface Ag: NEGATIVE

## 2015-02-12 LAB — OB RESULTS CONSOLE GC/CHLAMYDIA
Chlamydia: NEGATIVE
Gonorrhea: NEGATIVE

## 2015-08-10 ENCOUNTER — Ambulatory Visit (INDEPENDENT_AMBULATORY_CARE_PROVIDER_SITE_OTHER): Payer: Self-pay | Admitting: Pediatrics

## 2015-08-10 DIAGNOSIS — Z7681 Expectant parent(s) prebirth pediatrician visit: Secondary | ICD-10-CM

## 2015-08-10 LAB — OB RESULTS CONSOLE GBS: STREP GROUP B AG: NEGATIVE

## 2015-08-11 DIAGNOSIS — Z7681 Expectant parent(s) prebirth pediatrician visit: Secondary | ICD-10-CM | POA: Insufficient documentation

## 2015-08-11 NOTE — Progress Notes (Signed)
Prenatal counseling for impending newborn done-- Z76.81  

## 2015-09-18 ENCOUNTER — Inpatient Hospital Stay (HOSPITAL_COMMUNITY)
Admission: AD | Admit: 2015-09-18 | Discharge: 2015-09-21 | DRG: 775 | Disposition: A | Discharge status: Home / Self Care | Payer: Managed Care, Other (non HMO) | Source: Ambulatory Visit | Attending: Obstetrics & Gynecology | Admitting: Obstetrics & Gynecology

## 2015-09-18 ENCOUNTER — Inpatient Hospital Stay (HOSPITAL_COMMUNITY): Payer: Managed Care, Other (non HMO) | Admitting: Anesthesiology

## 2015-09-18 ENCOUNTER — Encounter (HOSPITAL_COMMUNITY): Payer: Self-pay

## 2015-09-18 DIAGNOSIS — O480 Post-term pregnancy: Principal | ICD-10-CM | POA: Diagnosis not present

## 2015-09-18 DIAGNOSIS — Z8249 Family history of ischemic heart disease and other diseases of the circulatory system: Secondary | ICD-10-CM | POA: Diagnosis not present

## 2015-09-18 DIAGNOSIS — O209 Hemorrhage in early pregnancy, unspecified: Secondary | ICD-10-CM

## 2015-09-18 DIAGNOSIS — O4292 Full-term premature rupture of membranes, unspecified as to length of time between rupture and onset of labor: Secondary | ICD-10-CM | POA: Diagnosis present

## 2015-09-18 DIAGNOSIS — O4210 Premature rupture of membranes, onset of labor more than 24 hours following rupture, unspecified weeks of gestation: Secondary | ICD-10-CM | POA: Diagnosis not present

## 2015-09-18 DIAGNOSIS — Z3A41 41 weeks gestation of pregnancy: Secondary | ICD-10-CM

## 2015-09-18 DIAGNOSIS — D62 Acute posthemorrhagic anemia: Secondary | ICD-10-CM | POA: Diagnosis not present

## 2015-09-18 HISTORY — DX: Acute posthemorrhagic anemia: D62

## 2015-09-18 HISTORY — DX: Full-term premature rupture of membranes, unspecified as to length of time between rupture and onset of labor: O42.92

## 2015-09-18 LAB — CBC
HEMATOCRIT: 38.5 % (ref 36.0–46.0)
Hemoglobin: 13.6 g/dL (ref 12.0–15.0)
MCH: 34.5 pg — ABNORMAL HIGH (ref 26.0–34.0)
MCHC: 35.3 g/dL (ref 30.0–36.0)
MCV: 97.7 fL (ref 78.0–100.0)
PLATELETS: 183 10*3/uL (ref 150–400)
RBC: 3.94 MIL/uL (ref 3.87–5.11)
RDW: 13.5 % (ref 11.5–15.5)
WBC: 22.7 10*3/uL — AB (ref 4.0–10.5)

## 2015-09-18 LAB — TYPE AND SCREEN
ABO/RH(D): O POS
ANTIBODY SCREEN: NEGATIVE

## 2015-09-18 MED ORDER — LIDOCAINE HCL (PF) 1 % IJ SOLN
30.0000 mL | INTRAMUSCULAR | Status: DC | PRN
  Filled 2015-09-18: qty 30

## 2015-09-18 MED ORDER — OXYCODONE-ACETAMINOPHEN 5-325 MG PO TABS: 2.0000 | ORAL_TABLET | ORAL | Status: DC | PRN

## 2015-09-18 MED ORDER — OXYTOCIN 40 UNITS IN LACTATED RINGERS INFUSION - SIMPLE MED: 62.5000 mL/h | INTRAVENOUS | Status: DC

## 2015-09-18 MED ORDER — CITRIC ACID-SODIUM CITRATE 334-500 MG/5ML PO SOLN: 30.0000 mL | ORAL | Status: DC | PRN

## 2015-09-18 MED ORDER — PHENYLEPHRINE 40 MCG/ML (10ML) SYRINGE FOR IV PUSH (FOR BLOOD PRESSURE SUPPORT)
80.0000 ug | PREFILLED_SYRINGE | INTRAVENOUS | Status: DC | PRN
  Administered 2015-09-18: 40 ug via INTRAVENOUS
  Filled 2015-09-18: qty 20

## 2015-09-18 MED ORDER — OXYTOCIN BOLUS FROM INFUSION
500.0000 mL | INTRAVENOUS | Status: DC
  Administered 2015-09-19: 500 mL via INTRAVENOUS

## 2015-09-18 MED ORDER — TERBUTALINE SULFATE 1 MG/ML IJ SOLN: 0.2500 mg | Freq: Once | INTRAMUSCULAR | Status: DC | PRN

## 2015-09-18 MED ORDER — LIDOCAINE-EPINEPHRINE (PF) 2 %-1:200000 IJ SOLN
INTRAMUSCULAR | Status: DC | PRN
  Administered 2015-09-18: 4 mL

## 2015-09-18 MED ORDER — OXYCODONE-ACETAMINOPHEN 5-325 MG PO TABS
1.0000 | ORAL_TABLET | ORAL | Status: DC | PRN
Start: 2015-09-18 — End: 2015-09-19

## 2015-09-18 MED ORDER — LACTATED RINGERS IV SOLN
500.0000 mL | INTRAVENOUS | Status: DC | PRN
  Administered 2015-09-18 – 2015-09-19 (×3): 1000 mL via INTRAVENOUS

## 2015-09-18 MED ORDER — FENTANYL CITRATE (PF) 100 MCG/2ML IJ SOLN
100.0000 ug | INTRAMUSCULAR | Status: DC | PRN
  Administered 2015-09-18: 100 ug via INTRAVENOUS
  Filled 2015-09-18: qty 2

## 2015-09-18 MED ORDER — FENTANYL 2.5 MCG/ML BUPIVACAINE 1/10 % EPIDURAL INFUSION (WH - ANES)
14.0000 mL/h | INTRAMUSCULAR | Status: DC | PRN
  Administered 2015-09-18: 14 mL/h via EPIDURAL
  Administered 2015-09-18: 12 mL/h via EPIDURAL
  Administered 2015-09-19: 14 mL/h via EPIDURAL
  Filled 2015-09-18 (×2): qty 125

## 2015-09-18 MED ORDER — EPHEDRINE 5 MG/ML INJ: 10.0000 mg | INTRAVENOUS | Status: DC | PRN

## 2015-09-18 MED ORDER — OXYTOCIN 10 UNIT/ML IJ SOLN: 10.0000 [IU] | Freq: Once | INTRAMUSCULAR | Status: DC

## 2015-09-18 MED ORDER — OXYTOCIN 40 UNITS IN LACTATED RINGERS INFUSION - SIMPLE MED
1.0000 m[IU]/min | INTRAVENOUS | Status: DC
  Administered 2015-09-18: 2 m[IU]/min via INTRAVENOUS
  Administered 2015-09-18: 4 m[IU]/min via INTRAVENOUS
  Filled 2015-09-18: qty 1000

## 2015-09-18 MED ORDER — DIPHENHYDRAMINE HCL 50 MG/ML IJ SOLN: 12.5000 mg | INTRAMUSCULAR | Status: DC | PRN

## 2015-09-18 MED ORDER — ONDANSETRON HCL 4 MG/2ML IJ SOLN: 4.0000 mg | Freq: Four times a day (QID) | INTRAMUSCULAR | Status: DC | PRN

## 2015-09-18 MED ORDER — BUPIVACAINE HCL (PF) 0.25 % IJ SOLN
INTRAMUSCULAR | Status: DC | PRN
  Administered 2015-09-18 (×2): 4 mL via EPIDURAL

## 2015-09-18 NOTE — Progress Notes (Signed)
Subjective:   Contractions stronger, breathing through. Leaning over bed.  Objective:   VS: Blood pressure 128/81, temperature 98 F (36.7 C), temperature source Oral, resp. rate 20, height  (1.676 m), weight 76.204 kg (168 lb), last menstrual period 11/30/2014, unknown if currently breastfeeding. FHR: 145 bpm, intermittent Toco: contractions every 2-4 minutes Cervix: deferred Membranes: clear  Assessment:  41.5 wks PROM  Labor: latent FHR category I  Plan:  Recommend Pitocin augmentation-declines now. Requests water immersion to facilitate labor progression. Plan water immersion x1 hr then recheck cervix, if minimal or no change recommend augmentation, do not recommend further expectant mngt.     Donette Larry, N MSN, CNM 09/18/2015, 12:13 PM

## 2015-09-18 NOTE — Progress Notes (Signed)
Discussed getting into birthing tub for an hour. Will reassess at that time, and discuss the use of pitocin.

## 2015-09-18 NOTE — Progress Notes (Addendum)
Subjective:   Moaning and breathing through ctx, getting out of tub for check.  Objective:   VS: Blood pressure 128/81, pulse 100, temperature 98.1 F (36.7 C), temperature source Oral, resp. rate 20, height  (1.676 m), weight 76.204 kg (168 lb), last menstrual period 11/30/2014, unknown if currently breastfeeding. FHR: baseline 145, mod variability, +accels, no decels Toco: contractions every 3-4 minutes Cervix: Dilation: 3.5 Effacement (%): 80 Station: -1 Exam by:: Maleiyah Releford Membranes: clear  Assessment:  Labor: latent FHR category I  Plan:  Little cervical change, ROM x15 hrs, recommend Pitocin augmentation. May have option to d/c Pitocin after active labor is established and potential to use birth tub. Peanut ball while in bed. Agrees with plan.     Donette Larry, N MSN, CNM 09/18/2015, 2:39 PM

## 2015-09-18 NOTE — Progress Notes (Signed)
Subjective:   Moaning with ctx, feels pressure to push, involuntary pushing, on birth ball.  Objective:   VS: Blood pressure 110/71, pulse 64, temperature 98.3 F (36.8 C), temperature source Oral, resp. rate 20, height  (1.676 m), weight 76.204 kg (168 lb), last menstrual period 11/30/2014, unknown if currently breastfeeding. FHR: baseline 145 / variability mod / accelerations + / occ. variable decelerations Toco: contractions every 2-3 minutes Cervix: Dilation: 6 Effacement (%): 80 Station: 0 Exam by:: Denyse Amass, MD Membranes: clear, +bloody show Pitocin: 4 mu/min  Assessment:  Labor: early active FHR category II  Plan:  Declines pain medications, strongly desires water immersion, discontinue Pitocin, continuous EFM x30 min, tub entry if FHT Cat I then intermittent EFM. Anticipate labor progression and SVD.     Donette Larry, N MSN, CNM 09/18/2015, 5:38 PM

## 2015-09-18 NOTE — Progress Notes (Signed)
Subjective:   Moaning with ctx, bearing down, s/p IV Fentanyl. Requests epidural.  Objective:   VS: Blood pressure 110/59, pulse 70, temperature 98.1 F (36.7 C), temperature source Oral, resp. rate 20, height  (1.676 m), weight 76.204 kg (168 lb), last menstrual period 11/30/2014, unknown if currently breastfeeding. FHR: baseline 145 / variability mod / accelerations + / occ variable decelerations Toco: contractions every 4 minutes Cervix: Dilation: 7 Effacement (%): 80 Station: 0 Exam by:: Chrles Selley CNM Membranes: clear Pitocin: off  Assessment:  Labor: protracted  FHR category II  Plan:  Epidural as desired, restart Pitocin, left lateral w/peanut ball, anticipate SVD. Dr. Juliene Pina updated with A/P.     Donette Larry, N MSN, CNM 09/18/2015, 9:36 PM

## 2015-09-18 NOTE — Anesthesia Procedure Notes (Signed)
Epidural Patient location during procedure: OB  Staffing Anesthesiologist: Kerrick Miler Performed by: anesthesiologist   Preanesthetic Checklist Completed: patient identified, surgical consent, pre-op evaluation, timeout performed, IV checked, risks and benefits discussed and monitors and equipment checked  Epidural Patient position: sitting Prep: DuraPrep Patient monitoring: heart rate, cardiac monitor, continuous pulse ox and blood pressure Approach: midline Location: L3-L4 Injection technique: LOR saline  Needle:  Needle type: Tuohy  Needle gauge: 17 G Needle length: 9 cm Needle insertion depth: 6 cm Catheter type: closed end flexible Catheter size: 19 Gauge Catheter at skin depth: 12 cm Test dose: negative and 2% lidocaine with Epi 1:200 K  Assessment Events: blood not aspirated, injection not painful, no injection resistance, negative IV test and no paresthesia  Additional Notes Reason for block:procedure for pain   

## 2015-09-18 NOTE — Progress Notes (Signed)
Oxytocin restarted per order of Melanie B. CNM . Ma Hillock

## 2015-09-18 NOTE — Anesthesia Preprocedure Evaluation (Signed)

## 2015-09-18 NOTE — H&P (Signed)
  OB ADMISSION/ HISTORY & PHYSICAL:  Admission Date: 09/18/2015  6:26 AM  Admit Diagnosis: 41.[redacted] weeks gestation, post dates, PROM  Ann Henry is a 34 y.o. female presenting for SROM. Planning water immersion and waterbirth.  Prenatal History: G2P0010   EDC:09/06/2015, by Last Menstrual Period   Prenatal care at [facility]  Primary Ob Provider: Dr. Billy Coast CNMs for waterbirth Prenatal course complicated by [facility]  Prenatal Labs: ABO, Rh: O (02/16 0000) Pos Antibody: Negative (02/16 0000) Rubella: Immune (02/16 0000)  RPR: Nonreactive (02/16 0000)  HBsAg: Negative (02/16 0000)  HIV: Non-reactive (02/16 0000)  GBS: Negative (08/23 0000)  1 hr GTT: 71 Genetic screen: nml  Medical / Surgical History :  Past medical history:  Past Medical History  Diagnosis Date  . Sinus infection   . Vaginal bleeding in pregnant patient at less than [redacted] weeks gestation 01/30/2015  . Full-term premature rupture of membranes 09/18/2015     Past surgical history:  Past Surgical History  Procedure Laterality Date  . Wisdom tooth extraction    . Dental implant    . Nasal polyp surgery    . Mouth surgery      Family History:  Family History  Problem Relation Age of Onset  . Hypertension Mother   . Heart disease Father      Social History:  reports that she has never smoked. She has never used smokeless tobacco. She reports that she drinks alcohol. She reports that she does not use illicit drugs.  Allergies: Review of patient's allergies indicates no known allergies.   Current Medications at time of admission:  Prior to Admission medications   Medication Sig Start Date End Date Taking? Authorizing Provider  BLACK COHOSH PO Take 2 tablets by mouth daily.   Yes Historical Provider, MD  Prenatal Vit-Fe Fumarate-FA (PRENATAL MULTIVITAMIN) TABS tablet Take 1 tablet by mouth daily at 12 noon.   Yes Historical Provider, MD     Review of Systems: +LOF, clear since  2300 +FM +ctx +back pain No VB Physical Exam:  VS: Height  (1.676 m), weight 76.204 kg (168 lb), last menstrual period 11/30/2014, unknown if currently breastfeeding.  General: alert and oriented, appears mildly uncomfortable w/ctx Heart: RRR Lungs: Clear lung fields Abdomen: Gravid, soft and non-tender, non-distended / uterus: gravid, non-tender Extremities: No edema Genitalia / VE: 2.5/80/-1, vtx EFW: 7lbs FHR: baseline rate 135 / variability mod / accelerations + / no decelerations TOCO: irregular  Assessment: 41.[redacted] weeks gestation PROM Labor: latent FHR category I GBS: neg  Plan: Discussed ruptured membranes x10 hrs, no active labor, risk for maternal/fetal infection-recommend Pitocin augmentation. May be able to discontinue once in active labor and if FHR reasurring, water immersion may be option. Requests time to decide on Pitocin after discussion with spouse and doula. Dr. Juliene Pina notified of admission / plan of care   Lawernce Pitts MSN, CNM 09/18/2015, 8:22 AM

## 2015-09-19 ENCOUNTER — Encounter (HOSPITAL_COMMUNITY): Payer: Self-pay | Admitting: Family Medicine

## 2015-09-19 DIAGNOSIS — O4210 Premature rupture of membranes, onset of labor more than 24 hours following rupture, unspecified weeks of gestation: Secondary | ICD-10-CM | POA: Diagnosis not present

## 2015-09-19 LAB — RPR: RPR Ser Ql: NONREACTIVE

## 2015-09-19 MED ORDER — BENZOCAINE-MENTHOL 20-0.5 % EX AERO
1.0000 "application " | INHALATION_SPRAY | CUTANEOUS | Status: DC | PRN
  Administered 2015-09-19: 1 via TOPICAL
  Filled 2015-09-19: qty 56

## 2015-09-19 MED ORDER — OXYCODONE-ACETAMINOPHEN 5-325 MG PO TABS: 2.0000 | ORAL_TABLET | ORAL | Status: DC | PRN

## 2015-09-19 MED ORDER — LACTATED RINGERS IV SOLN: INTRAVENOUS | Status: DC

## 2015-09-19 MED ORDER — OXYCODONE-ACETAMINOPHEN 5-325 MG PO TABS: 1.0000 | ORAL_TABLET | ORAL | Status: DC | PRN

## 2015-09-19 MED ORDER — DIBUCAINE 1 % RE OINT
1.0000 | TOPICAL_OINTMENT | RECTAL | Status: DC | PRN
Start: 2015-09-19 — End: 2015-09-21

## 2015-09-19 MED ORDER — LANOLIN HYDROUS EX OINT: TOPICAL_OINTMENT | CUTANEOUS | Status: DC | PRN

## 2015-09-19 MED ORDER — WITCH HAZEL-GLYCERIN EX PADS: 1.0000 "application " | MEDICATED_PAD | CUTANEOUS | Status: DC | PRN

## 2015-09-19 MED ORDER — SENNOSIDES-DOCUSATE SODIUM 8.6-50 MG PO TABS
2.0000 | ORAL_TABLET | ORAL | Status: DC
  Administered 2015-09-20 (×2): 2 via ORAL
  Filled 2015-09-19 (×2): qty 2

## 2015-09-19 MED ORDER — LACTATED RINGERS IV SOLN
INTRAVENOUS | Status: DC
  Administered 2015-09-19: 05:00:00 via INTRAUTERINE

## 2015-09-19 MED ORDER — ONDANSETRON HCL 4 MG/2ML IJ SOLN: 4.0000 mg | INTRAMUSCULAR | Status: DC | PRN

## 2015-09-19 MED ORDER — TETANUS-DIPHTH-ACELL PERTUSSIS 5-2.5-18.5 LF-MCG/0.5 IM SUSP: 0.5000 mL | Freq: Once | INTRAMUSCULAR | Status: DC

## 2015-09-19 MED ORDER — ONDANSETRON HCL 4 MG PO TABS: 4.0000 mg | ORAL_TABLET | ORAL | Status: DC | PRN

## 2015-09-19 MED ORDER — PRENATAL MULTIVITAMIN CH
1.0000 | ORAL_TABLET | Freq: Every day | ORAL | Status: DC
  Administered 2015-09-19 – 2015-09-21 (×3): 1 via ORAL
  Filled 2015-09-19 (×3): qty 1

## 2015-09-19 MED ORDER — IBUPROFEN 600 MG PO TABS
600.0000 mg | ORAL_TABLET | Freq: Four times a day (QID) | ORAL | Status: DC
  Administered 2015-09-19 – 2015-09-21 (×9): 600 mg via ORAL
  Filled 2015-09-19 (×9): qty 1

## 2015-09-19 MED ORDER — ACETAMINOPHEN 325 MG PO TABS: 650.0000 mg | ORAL_TABLET | ORAL | Status: DC | PRN

## 2015-09-19 MED ORDER — SIMETHICONE 80 MG PO CHEW: 80.0000 mg | CHEWABLE_TABLET | ORAL | Status: DC | PRN

## 2015-09-19 MED ORDER — DIPHENHYDRAMINE HCL 25 MG PO CAPS: 25.0000 mg | ORAL_CAPSULE | Freq: Four times a day (QID) | ORAL | Status: DC | PRN

## 2015-09-19 NOTE — Progress Notes (Signed)
Pt to labor down for one hour with Oxytocin infusion to remain at 2 milliunit/min per Melanie B. CNM. Ma Hillock

## 2015-09-19 NOTE — Lactation Note (Signed)
This note was copied from the chart of Girl Halo Scheibe. Lactation Consultation Note  Patient Name: Girl Patrycja Mumpower [id number] Date: 09/19/2015 Reason for consult: Initial assessment First time mom that did not take a bf class. She has been learning from her sister and sister in law about bf. She reports baby latches well to the R but has trouble with the L. She thinks the L nipple does not get as firm as the R so baby has a trouble maintaining the latch. Both nipple appear normal and symmetrical. Went over milk transition, feeding frequency, belly size, and breast care. Mom was able to demonstrate manual expression. She is aware of support groups and O/P lactation services.    Maternal Data    Feeding    LATCH Score/Interventions Latch:  (baby was sleeping mom reports baby just fed)                    Lactation Tools Discussed/Used WIC Program: No   Consult Status Consult Status: Follow-up Date: 09/20/15 Follow-up type: In-patient    Rulon Eisenmenger 09/19/2015, 5:07 PM

## 2015-09-19 NOTE — Progress Notes (Addendum)
Patient is requesting that the erythromycin ointment delayed until she transfers to mother baby.

## 2015-09-19 NOTE — Progress Notes (Signed)
Subjective:   Comfortable with ctx, left lateral.  Objective:   VS: Blood pressure 101/54, pulse 61, temperature 98.4 F (36.9 C), temperature source Oral, resp. rate 16, height  (1.676 m), weight 76.204 kg (168 lb), last menstrual period 11/30/2014, SpO2 99 %, unknown if currently breastfeeding. FHR: baseline 135 / variability mod / accelerations + / occ variable decelerations Toco: contractions every 2-7 minutes Cervix: Dilation: 7 Effacement (%): 80 Station: 0 Exam by:: Shawna Orleans, CNM Membranes: clear Pitocin: 2 mu/min  Assessment:  Labor: arrest  FHR category I Prolonged ROM-no evidence of infection  Plan:  Pitocin titration for adequate labor pattern, rt lateral peanut ball, guarded for SVD. Dr. Juliene Pina updated with A/P.     Donette Larry, N MSN, CNM 09/19/2015, 2:47 AM

## 2015-09-19 NOTE — Progress Notes (Signed)
O2 discontinued per Melanie B. CNM. Ma Hillock

## 2015-09-19 NOTE — Progress Notes (Signed)
Subjective:   Mostly comfortable with epidural, small area on RLQ feeling pain.  Objective:   VS: Blood pressure 101/68, pulse 90, temperature 98.1 F (36.7 C), temperature source Oral, resp. rate 20, height  (1.676 m), weight 76.204 kg (168 lb), last menstrual period 11/30/2014, SpO2 99 %, unknown if currently breastfeeding. FHR: baseline 150 / variability mod / accelerations + / variable decelerations Toco: contractions every 2-4 minutes Cervix: Dilation: 9 Effacement (%): 100 Station: 0 Exam by:: Saisha Hogue, CNM  IUPC placed Membranes: clear Pitocin: off  Assessment:  Labor: active FHR category II Prolonged ROM  Plan:  Amnioinfusion for cord compression, IVF bolus, reposition with peanut ball, PCA dose, active pushing at onset of second stage, guarded for SVD. Dr. Juliene Pina updated with A/P.     Ann Henry, N MSN, CNM 09/19/2015, 5:37 AM

## 2015-09-19 NOTE — Progress Notes (Signed)
250 ml bolus initiated per order of Melanie B. CNM. Ma Hillock

## 2015-09-20 ENCOUNTER — Encounter (HOSPITAL_COMMUNITY): Payer: Self-pay | Admitting: Obstetrics and Gynecology

## 2015-09-20 DIAGNOSIS — D62 Acute posthemorrhagic anemia: Secondary | ICD-10-CM

## 2015-09-20 HISTORY — DX: Acute posthemorrhagic anemia: D62

## 2015-09-20 LAB — CBC
HEMATOCRIT: 31.4 % — AB (ref 36.0–46.0)
Hemoglobin: 10.5 g/dL — ABNORMAL LOW (ref 12.0–15.0)
MCH: 34 pg (ref 26.0–34.0)
MCHC: 33.4 g/dL (ref 30.0–36.0)
MCV: 101.6 fL — AB (ref 78.0–100.0)
Platelets: 151 10*3/uL (ref 150–400)
RBC: 3.09 MIL/uL — ABNORMAL LOW (ref 3.87–5.11)
RDW: 14.1 % (ref 11.5–15.5)
WBC: 15.1 10*3/uL — AB (ref 4.0–10.5)

## 2015-09-20 NOTE — Lactation Note (Signed)
This note was copied from the chart of Ann Henry. Lactation Consultation Note  Patient Name: Ann Nyari Olsson [id number] Date: 09/20/2015 Reason for consult: Follow-up assessment Mom had baby latched to left breast when LC arrived. Adjusted position of baby for better alignment. Baby demonstrating a good suckling pattern and Mom denies discomfort. Mom c/o of soreness on right nipple, small bruise present. Care for sore nipples reviewed, comfort gels given with instructions. Basic teaching reviewed with Mom, cluster feeding discussed. Reviewed with Mom how to obtain good depth with latch. Encouraged to call for assist as needed.   Maternal Data    Feeding Feeding Type: Breast Fed Length of feed: 15 min  LATCH Score/Interventions Latch: Grasps breast easily, tongue down, lips flanged, rhythmical sucking.  Audible Swallowing: A few with stimulation  Type of Nipple: Everted at rest and after stimulation  Comfort (Breast/Nipple): Filling, red/small blisters or bruises, mild/mod discomfort     Hold (Positioning): No assistance needed to correctly position infant at breast.  LATCH Score: 8  Lactation Tools Discussed/Used Tools: Comfort gels   Consult Status Consult Status: Follow-up Date: 09/20/15 Follow-up type: In-patient    Alfred Levins 09/20/2015, 4:39 PM

## 2015-09-20 NOTE — Progress Notes (Signed)
Patient ID: Ann Henry, female   DOB: 1981-12-18, 34 y.o.   MRN: [id number] PPD # 1 SVD  S:  Reports feeling a little tired, but well             Tolerating po/ No nausea or vomiting             Bleeding is light             Pain controlled with ibuprofen (OTC)             Up ad lib / ambulatory / voiding without difficulties    Newborn  Information for the patient's newborn:  [name], [name] [[id number]]  female  breast feeding   O:  A & O x 3, in no apparent distress              VS:  Filed Vitals:   09/19/15 1220 09/19/15 1620 09/19/15 1837 09/20/15 0720  BP: 112/71 103/67 110/66 105/67  Pulse: 81 65 86 72  Temp:  98.2 F (36.8 C) 98.4 F (36.9 C) 98.6 F (37 C)  TempSrc:  Oral Oral Oral  Resp: Height:      Weight:      SpO2:   99%     LABS:  Recent Labs  09/18/15 1025 09/20/15 0545  WBC 22.7* 15.1*  HGB 13.6 10.5*  HCT 38.5 31.4*  PLT 183 151    Blood type: --/--/O POS (10/01 1025)  Rubella: Immune (02/16 0000)   I&O: I/O last 3 completed shifts: In: -  Out: 1627 [Urine:1100; Blood:527]             Lungs: Clear and unlabored  Heart: regular rate and rhythm / no murmurs  Abdomen: soft, non-tender, non-distended              Fundus: firm, non-tender, U-1  Perineum: 1st degree repair healing well, no edema  Lochia: minimal  Extremities: No edema, no calf pain or tenderness, No Homans    A/P: PPD # 1  34 y.o., [id number]   Principal Problem:    Postpartum care following vaginal delivery (10/2)  Active Problems:    Full-term premature rupture of membranes    Post-dates pregnancy    Prolonged rupture of membranes, greater than 24 hours, delivered    Mild ABL anemia   Doing well - stable status  Routine post partum orders  Anticipate discharge tomorrow    Raelyn Mora, M, MSN, CNM 09/20/2015, 10:40 AM

## 2015-09-20 NOTE — Anesthesia Postprocedure Evaluation (Signed)
Anesthesia Post Note  Patient: Ann Henry  Procedure(s) Performed: * No procedures listed *  Anesthesia type: Epidural  Patient location: Mother/Baby  Post pain: Pain level controlled  Post assessment: Post-op Vital signs reviewed  Last Vitals:  Filed Vitals:   09/20/15 0720  BP: 105/67  Pulse: 72  Temp: 37 C  Resp: 20    Post vital signs: Reviewed  Level of consciousness:alert  Complications: No apparent anesthesia complications

## 2015-09-20 NOTE — Addendum Note (Signed)
Addendum  created 09/20/15 0849 by Algis Greenhouse, CRNA   Modules edited: Charges VN, Notes Section   Notes Section:  File: [id number]

## 2015-09-21 ENCOUNTER — Ambulatory Visit: Payer: Self-pay

## 2015-09-21 MED ORDER — IBUPROFEN 600 MG PO TABS: 600.0000 mg | ORAL_TABLET | Freq: Four times a day (QID) | ORAL | Status: DC

## 2015-09-21 MED ORDER — OXYCODONE-ACETAMINOPHEN 5-325 MG PO TABS: 1.0000 | ORAL_TABLET | ORAL | Status: DC | PRN

## 2015-09-21 NOTE — Progress Notes (Signed)
PPD 2 SVD with 1st degree repair  S:  Reports feeling tired             Tolerating po/ No nausea or vomiting             Bleeding is light             Pain controlled with motrin             Up ad lib / ambulatory / voiding QS  Newborn breast feeding   O:               VS: BP 113/58 mmHg  Pulse 63  Temp(Src) 98.3 F (36.8 C) (Oral)  Resp 18  Ht  (1.676 m)  Wt 76.204 kg (168 lb)  BMI 27.13 kg/m2  SpO2 99%  LMP 11/30/2014  Breastfeeding? Unknown   LABS:              Recent Labs  09/18/15 1025 09/20/15 0545  WBC 22.7* 15.1*  HGB 13.6 10.5*  PLT 183 151               Blood type: --/--/O POS (10/01 1025)  Rubella: Immune (02/16 0000)                tdap current(2016) / declines flu and pneumovax                      Physical Exam:             Alert and oriented X3  Abdomen: soft, non-tender, non-distended              Fundus: firm, non-tender, U-1  Perineum: mild edema  Lochia: light  Extremities: trace edema, no calf pain or tenderness    A: PPD # 2 SVD with 1st degree repair   Doing well - stable status  P: Routine post partum orders  DC home - WOB booklet - instructions reviewed  Marlinda Mike CNM, MSN, [facility] 09/21/2015, 7:38 AM

## 2015-09-21 NOTE — Lactation Note (Signed)
This note was copied from the chart of Ann Sayda Bouillon. Lactation Consultation Note: Mother has been breastfeeding every 2-3 hours. Infant is sustaining latch for 10-30 mins with each feeding . Mother states she was slightly sore on the first day , but that has resolved. Mother has comfort gels as needed. Mother was advised to continue to feed infant with feeding cues and at least 8-12 hours in 24 hours. Mother advised in treatment to prevent engorgement. Reviewed baby and me book on milk storage. Discussed cluster feeding and growth spurts. Staff nurse to give mother a hand pump. Mother has her own electric pump at home. Mother receptive to all breastfeeding teaching. Father at the bedside for teaching.   Patient Name: Ann Henry Date: 09/21/2015 Reason for consult: Follow-up assessment   Maternal Data    Feeding    LATCH Score/Interventions                      Lactation Tools Discussed/Used     Consult Status Consult Status: Complete    Ann Henry 09/21/2015, 3:11 PM

## 2015-09-21 NOTE — Discharge Summary (Signed)
Obstetric Discharge Summary  Reason for Admission: induction of labor Prenatal Procedures: NST and ultrasound Intrapartum Procedures: spontaneous vaginal delivery Postpartum Procedures: none Complications-Operative and Postpartum: 1st degree perineal laceration HEMOGLOBIN  Date Value Ref Range Status  09/20/2015 10.5* 12.0 - 15.0 g/dL Final    Comment:    DELTA CHECK NOTED REPEATED TO VERIFY    HCT  Date Value Ref Range Status  09/20/2015 31.4* 36.0 - 46.0 % Final    Physical Exam:  General: alert, cooperative and no distress Lochia: appropriate Uterine Fundus: firm Incision: healing well DVT Evaluation: No evidence of DVT seen on physical exam.  Discharge Diagnoses: Term Pregnancy-delivered  Discharge Information: Date: 09/21/2015 Activity: pelvic rest Diet: routine Medications: PNV, Ibuprofen and Percocet Condition: stable Instructions: refer to practice specific booklet Discharge to: home Follow-up Information    Follow up with Lenoard Aden, MD. Schedule an appointment as soon as possible for a visit in 6 weeks.   Specialty:  Obstetrics and Gynecology   Contact information:   [address] [place] [address] [phone]       Newborn Data: Live born female  Birth Weight: 7 lb 5 oz (3317 g) APGAR: 8, 9  Home with mother.  Marlinda Mike 09/21/2015, 9:04 AM

## 2016-06-29 IMAGING — US US OB TRANSVAGINAL
1 series · 14 of 28 positions shown · non-contrast
Comparison: None.

CLINICAL DATA: Vaginal bleeding in pregnancy, first trimester.
Initial encounter.

EXAM:
OBSTETRIC <14 WK US AND TRANSVAGINAL OB US
TECHNIQUE: Both transabdominal and transvaginal ultrasound examinations were
performed for complete evaluation of the gestation as well as the
maternal uterus, adnexal regions, and pelvic cul-de-sac.
Transvaginal technique was performed to assess early pregnancy.

[Series 1: us ob comp less 14 wks · 14 of 45 slices shown]
[im 2/45]
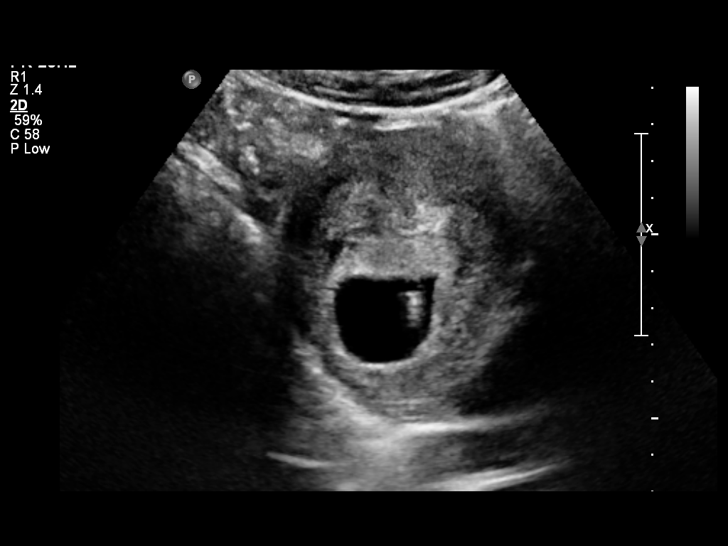
[im 5/45]
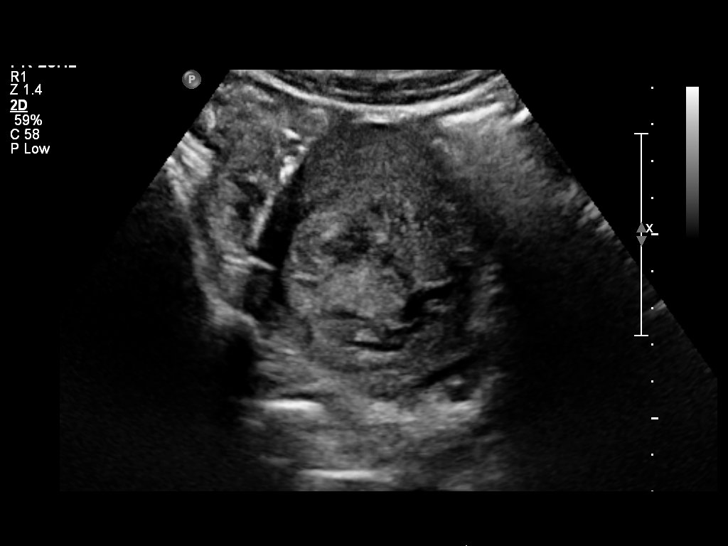
[im 9/45]
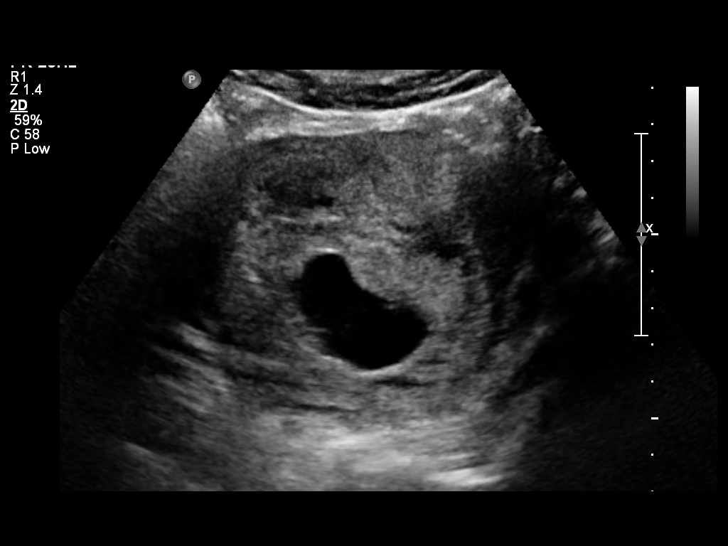
[im 12/45]
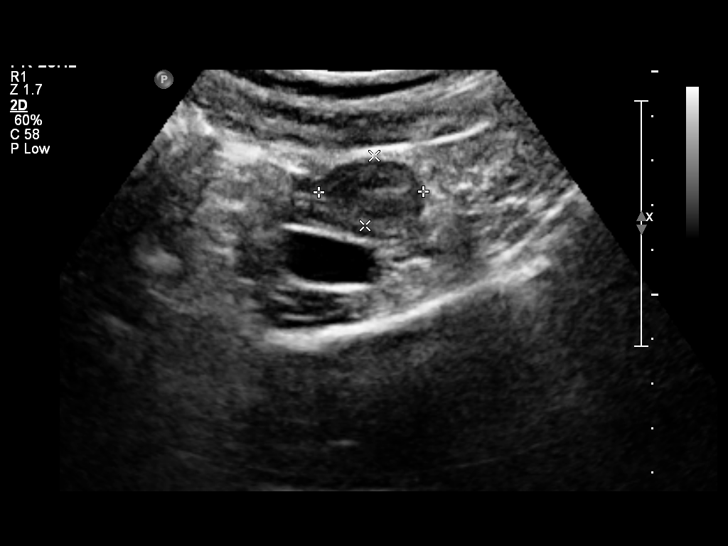
[im 15/45]
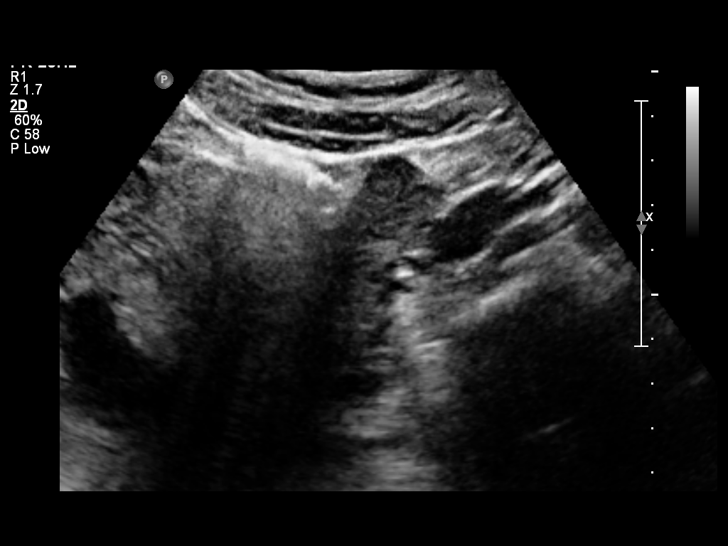
[im 18/45]
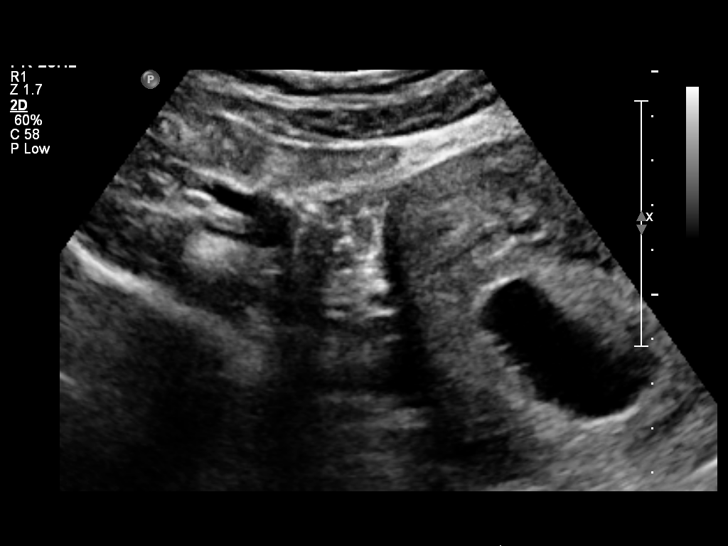
[im 22/45]
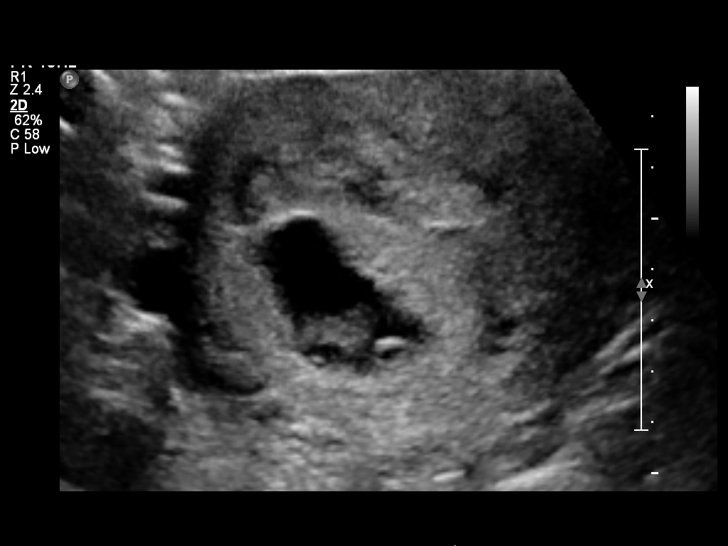
[im 25/45]
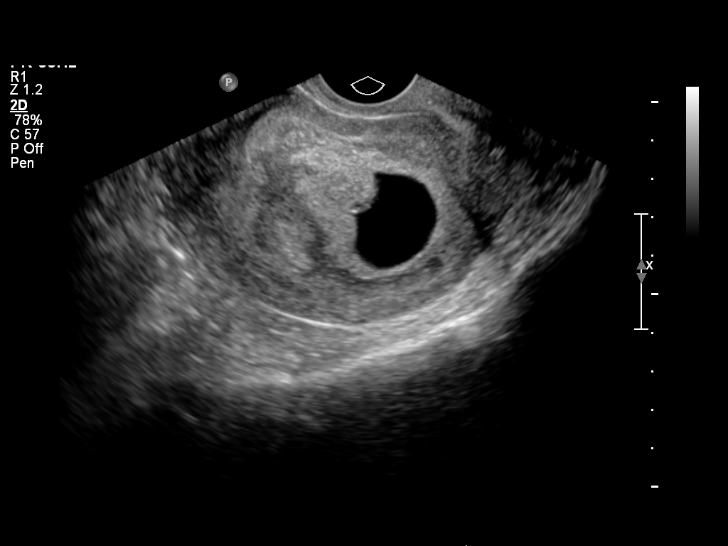
[im 28/45]
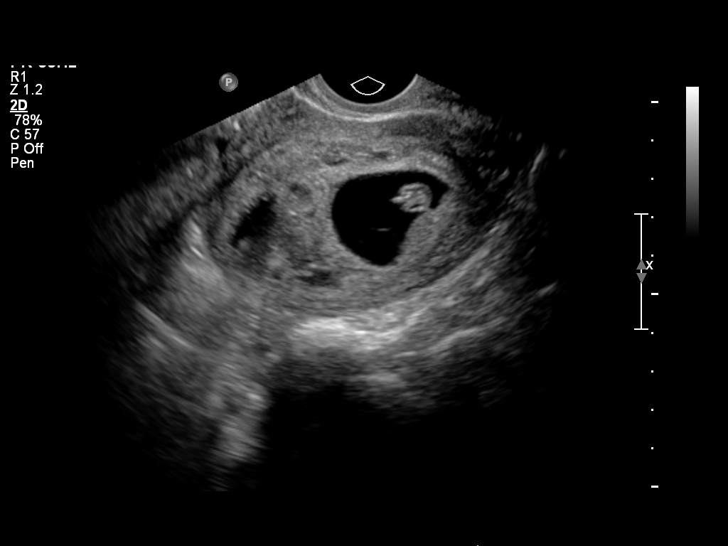
[im 31/45]
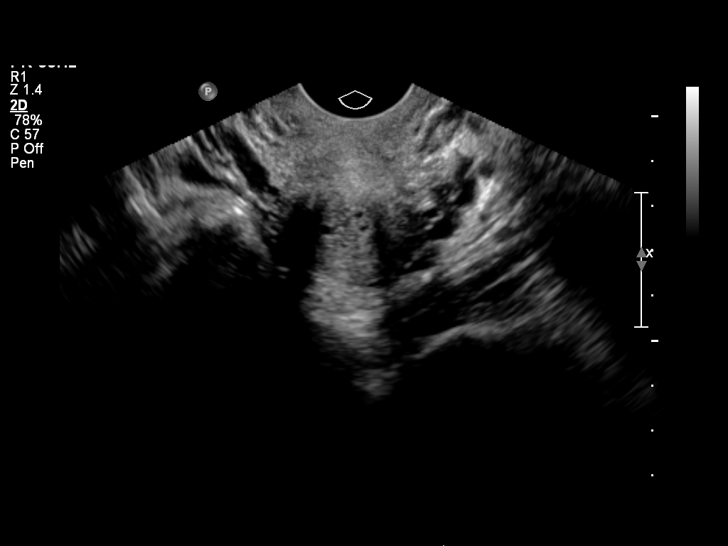
[im 35/45]
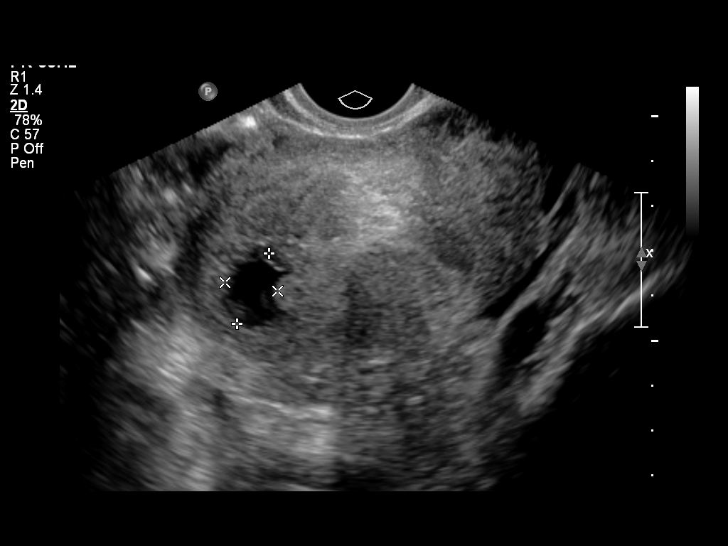
[im 38/45]
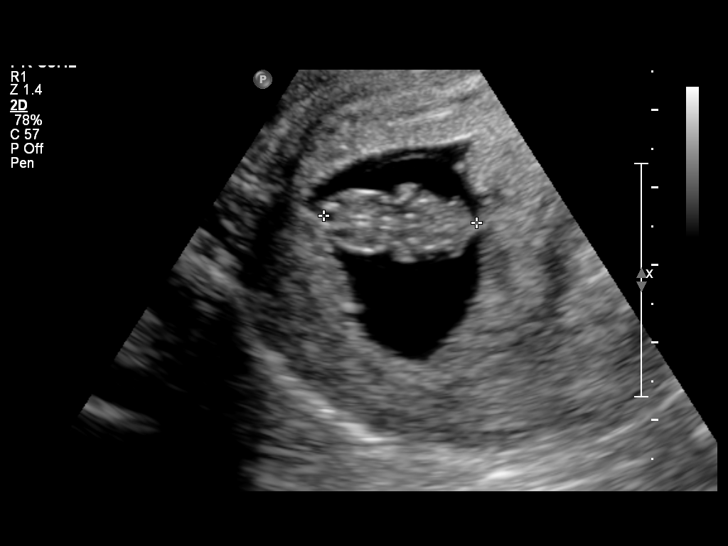
[im 41/45]
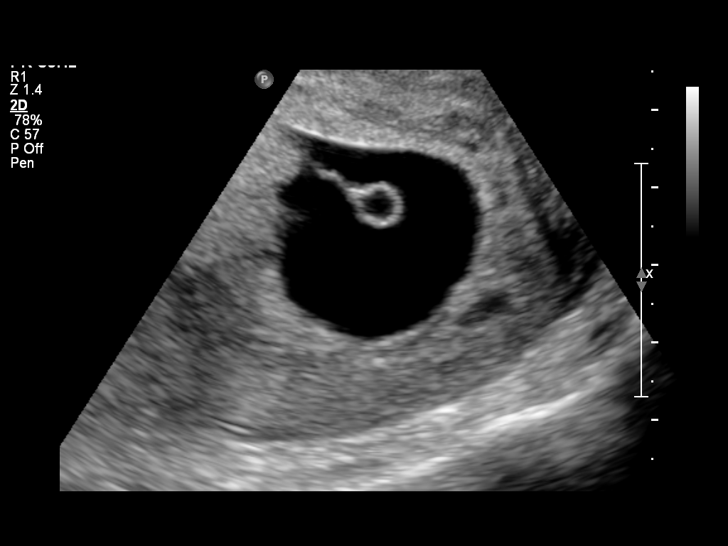
[im 45/45]
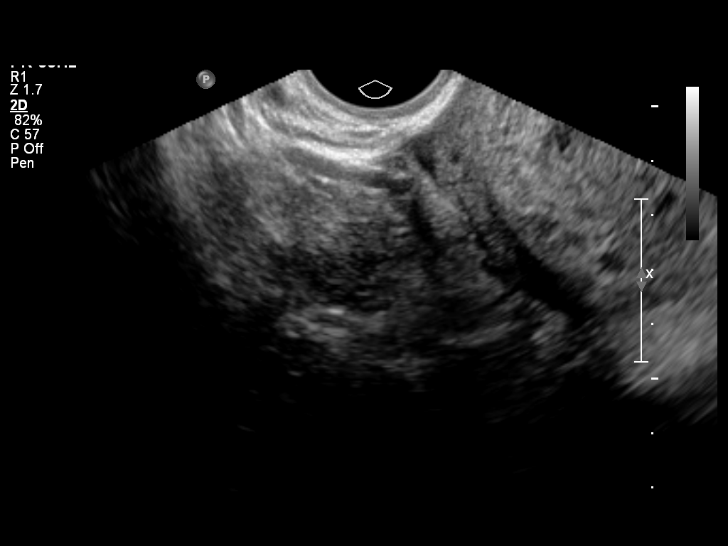

[14 of 28 positions shown; findings below may reference images not displayed]

FINDINGS: Intrauterine gestational sac: Visualized/normal in shape.

Yolk sac:  Visible

Embryo:  Visible

Cardiac Activity: detected

Heart Rate: 170 bpm

CRL:  2.0   mm   8  w   5  d

Maternal uterus/adnexae: Small volume subchorionic hemorrhage seen
in the upper endometrium. No pelvic free fluid. Both ovaries appear
normal, seen transabdominally.
IMPRESSION: 1.  Single living IUP demonstrated.

2.  Small volume subchorionic hemorrhage.  No pelvic free fluid.

## 2017-12-18 NOTE — L&D Delivery Note (Signed)
Delivery Note  First Stage: Labor onset: 12/07/18 @ 2300 Augmentation : Pitocin  Analgesia Eliezer Lofts/Anesthesia intrapartum: epidural  SROM at 2300 - clear/pink fluid   Second Stage: Complete dilation at 12/08/18@ 11:49am  Onset of pushing at 11:49am FHR second stage Cat 2 - variables with pushing, but good return to baseline  Delivery of a viable, healthy female "Arlo" at 12:17pm by Carlean JewsMeredith Ailea Rhatigan, CNM in Direct OA position with restitution to LOA/LOT Loose nuchal cord x1 - reduced over baby's head on perineum  Cord double clamped after cessation of pulsation, cut by FOB Cord blood sample collected   Third Stage: Placenta delivered via Tomasa BlaseSchultz intact with 3 VC @ 12:22pm Placenta disposition: hospital disposal  Uterine tone firm with massage and IV Pitocin bolus / bleeding - moderate trickle, several blood clots expressed from lower uterine segment. I re-evaluated placenta and it was intact. Uterus contracting well, and no further trickle after clots expressed   Bilateral labia minora lacerations identified - hemostatic Vaginal mucosal abrasion at introitus - hemostatic, not repaired   Anesthesia for repair: epidural, 1% Lidocaine  Repair: 4.0 vicryl - single stitch placed at the widest part of each labial laceration - hemostatic throughout Est. Blood Loss (mL): 154mL  Complications: none  Mom to postpartum.  Baby to Couplet care / Skin to Skin.  Newborn: Birth Weight: pending  Apgar Scores: 9, 9 Feeding planned: breast  Carlean JewsMeredith Ruvi Fullenwider, MSN, CNM [facility]

## 2018-04-09 DIAGNOSIS — Z3201 Encounter for pregnancy test, result positive: Secondary | ICD-10-CM | POA: Diagnosis not present

## 2018-04-29 DIAGNOSIS — Z3201 Encounter for pregnancy test, result positive: Secondary | ICD-10-CM | POA: Diagnosis not present

## 2018-05-06 DIAGNOSIS — Z3689 Encounter for other specified antenatal screening: Secondary | ICD-10-CM | POA: Diagnosis not present

## 2018-05-06 DIAGNOSIS — Z3A09 9 weeks gestation of pregnancy: Secondary | ICD-10-CM | POA: Diagnosis not present

## 2018-05-06 DIAGNOSIS — O09521 Supervision of elderly multigravida, first trimester: Secondary | ICD-10-CM | POA: Diagnosis not present

## 2018-05-06 LAB — OB RESULTS CONSOLE RUBELLA ANTIBODY, IGM: Rubella: IMMUNE

## 2018-05-06 LAB — OB RESULTS CONSOLE HEPATITIS B SURFACE ANTIGEN: Hepatitis B Surface Ag: NEGATIVE

## 2018-05-06 LAB — OB RESULTS CONSOLE HIV ANTIBODY (ROUTINE TESTING): HIV: NONREACTIVE

## 2018-05-06 LAB — OB RESULTS CONSOLE RPR: RPR: NONREACTIVE

## 2018-05-14 DIAGNOSIS — Z3689 Encounter for other specified antenatal screening: Secondary | ICD-10-CM | POA: Diagnosis not present

## 2018-05-14 DIAGNOSIS — O09521 Supervision of elderly multigravida, first trimester: Secondary | ICD-10-CM | POA: Diagnosis not present

## 2018-05-15 DIAGNOSIS — Z3A10 10 weeks gestation of pregnancy: Secondary | ICD-10-CM | POA: Diagnosis not present

## 2018-05-15 DIAGNOSIS — O09521 Supervision of elderly multigravida, first trimester: Secondary | ICD-10-CM | POA: Diagnosis not present

## 2018-06-10 DIAGNOSIS — Z3A14 14 weeks gestation of pregnancy: Secondary | ICD-10-CM | POA: Diagnosis not present

## 2018-06-10 DIAGNOSIS — O09522 Supervision of elderly multigravida, second trimester: Secondary | ICD-10-CM | POA: Diagnosis not present

## 2018-06-24 DIAGNOSIS — Z361 Encounter for antenatal screening for raised alphafetoprotein level: Secondary | ICD-10-CM | POA: Diagnosis not present

## 2018-06-24 DIAGNOSIS — Z3A16 16 weeks gestation of pregnancy: Secondary | ICD-10-CM | POA: Diagnosis not present

## 2018-06-24 DIAGNOSIS — O09522 Supervision of elderly multigravida, second trimester: Secondary | ICD-10-CM | POA: Diagnosis not present

## 2018-07-10 DIAGNOSIS — O09522 Supervision of elderly multigravida, second trimester: Secondary | ICD-10-CM | POA: Diagnosis not present

## 2018-07-10 DIAGNOSIS — Z3A18 18 weeks gestation of pregnancy: Secondary | ICD-10-CM | POA: Diagnosis not present

## 2018-07-10 DIAGNOSIS — Z363 Encounter for antenatal screening for malformations: Secondary | ICD-10-CM | POA: Diagnosis not present

## 2018-07-25 DIAGNOSIS — Z362 Encounter for other antenatal screening follow-up: Secondary | ICD-10-CM | POA: Diagnosis not present

## 2018-07-25 DIAGNOSIS — O09522 Supervision of elderly multigravida, second trimester: Secondary | ICD-10-CM | POA: Diagnosis not present

## 2018-07-25 DIAGNOSIS — Z3A20 20 weeks gestation of pregnancy: Secondary | ICD-10-CM | POA: Diagnosis not present

## 2018-08-22 DIAGNOSIS — Z3A24 24 weeks gestation of pregnancy: Secondary | ICD-10-CM | POA: Diagnosis not present

## 2018-08-22 DIAGNOSIS — O09522 Supervision of elderly multigravida, second trimester: Secondary | ICD-10-CM | POA: Diagnosis not present

## 2018-09-13 DIAGNOSIS — Z3689 Encounter for other specified antenatal screening: Secondary | ICD-10-CM | POA: Diagnosis not present

## 2018-09-13 DIAGNOSIS — O09522 Supervision of elderly multigravida, second trimester: Secondary | ICD-10-CM | POA: Diagnosis not present

## 2018-09-13 DIAGNOSIS — Z3A27 27 weeks gestation of pregnancy: Secondary | ICD-10-CM | POA: Diagnosis not present

## 2018-09-24 DIAGNOSIS — Z0000 Encounter for general adult medical examination without abnormal findings: Secondary | ICD-10-CM | POA: Diagnosis not present

## 2018-09-24 DIAGNOSIS — Z136 Encounter for screening for cardiovascular disorders: Secondary | ICD-10-CM | POA: Diagnosis not present

## 2018-09-27 DIAGNOSIS — O09523 Supervision of elderly multigravida, third trimester: Secondary | ICD-10-CM | POA: Diagnosis not present

## 2018-09-27 DIAGNOSIS — Z23 Encounter for immunization: Secondary | ICD-10-CM | POA: Diagnosis not present

## 2018-09-27 DIAGNOSIS — Z3A29 29 weeks gestation of pregnancy: Secondary | ICD-10-CM | POA: Diagnosis not present

## 2018-09-27 DIAGNOSIS — Z3689 Encounter for other specified antenatal screening: Secondary | ICD-10-CM | POA: Diagnosis not present

## 2018-10-07 DIAGNOSIS — Z3A31 31 weeks gestation of pregnancy: Secondary | ICD-10-CM | POA: Diagnosis not present

## 2018-10-07 DIAGNOSIS — O09523 Supervision of elderly multigravida, third trimester: Secondary | ICD-10-CM | POA: Diagnosis not present

## 2018-10-21 DIAGNOSIS — O09523 Supervision of elderly multigravida, third trimester: Secondary | ICD-10-CM | POA: Diagnosis not present

## 2018-10-21 DIAGNOSIS — Z3A33 33 weeks gestation of pregnancy: Secondary | ICD-10-CM | POA: Diagnosis not present

## 2018-11-08 DIAGNOSIS — O09523 Supervision of elderly multigravida, third trimester: Secondary | ICD-10-CM | POA: Diagnosis not present

## 2018-11-08 DIAGNOSIS — Z3A35 35 weeks gestation of pregnancy: Secondary | ICD-10-CM | POA: Diagnosis not present

## 2018-11-08 DIAGNOSIS — Z3685 Encounter for antenatal screening for Streptococcus B: Secondary | ICD-10-CM | POA: Diagnosis not present

## 2018-11-13 DIAGNOSIS — O09523 Supervision of elderly multigravida, third trimester: Secondary | ICD-10-CM | POA: Diagnosis not present

## 2018-11-13 DIAGNOSIS — Z3A36 36 weeks gestation of pregnancy: Secondary | ICD-10-CM | POA: Diagnosis not present

## 2018-11-18 DIAGNOSIS — Z3A37 37 weeks gestation of pregnancy: Secondary | ICD-10-CM | POA: Diagnosis not present

## 2018-11-18 DIAGNOSIS — O09523 Supervision of elderly multigravida, third trimester: Secondary | ICD-10-CM | POA: Diagnosis not present

## 2018-11-29 DIAGNOSIS — Z3A38 38 weeks gestation of pregnancy: Secondary | ICD-10-CM | POA: Diagnosis not present

## 2018-11-29 DIAGNOSIS — O3663X0 Maternal care for excessive fetal growth, third trimester, not applicable or unspecified: Secondary | ICD-10-CM | POA: Diagnosis not present

## 2018-12-05 DIAGNOSIS — O3663X0 Maternal care for excessive fetal growth, third trimester, not applicable or unspecified: Secondary | ICD-10-CM | POA: Diagnosis not present

## 2018-12-05 DIAGNOSIS — Z3A39 39 weeks gestation of pregnancy: Secondary | ICD-10-CM | POA: Diagnosis not present

## 2018-12-08 ENCOUNTER — Encounter (HOSPITAL_COMMUNITY): Payer: Self-pay | Admitting: *Deleted

## 2018-12-08 ENCOUNTER — Inpatient Hospital Stay (HOSPITAL_COMMUNITY): Payer: BLUE CROSS/BLUE SHIELD | Admitting: Anesthesiology

## 2018-12-08 ENCOUNTER — Inpatient Hospital Stay (HOSPITAL_COMMUNITY)
Admission: AD | Admit: 2018-12-08 | Discharge: 2018-12-10 | DRG: 807 | Disposition: A | Discharge status: Home / Self Care | Payer: BLUE CROSS/BLUE SHIELD | Attending: Obstetrics and Gynecology | Admitting: Obstetrics and Gynecology

## 2018-12-08 DIAGNOSIS — Z3483 Encounter for supervision of other normal pregnancy, third trimester: Secondary | ICD-10-CM | POA: Diagnosis not present

## 2018-12-08 DIAGNOSIS — Z3A39 39 weeks gestation of pregnancy: Secondary | ICD-10-CM

## 2018-12-08 HISTORY — DX: Cardiac murmur, unspecified: R01.1

## 2018-12-08 LAB — CBC
HCT: 39.7 % (ref 36.0–46.0)
Hemoglobin: 13.4 g/dL (ref 12.0–15.0)
MCH: 33.1 pg (ref 26.0–34.0)
MCHC: 33.8 g/dL (ref 30.0–36.0)
MCV: 98 fL (ref 80.0–100.0)
Platelets: 189 10*3/uL (ref 150–400)
RBC: 4.05 MIL/uL (ref 3.87–5.11)
RDW: 13 % (ref 11.5–15.5)
WBC: 16.6 10*3/uL — ABNORMAL HIGH (ref 4.0–10.5)

## 2018-12-08 LAB — TYPE AND SCREEN
ABO/RH(D): O POS
Antibody Screen: NEGATIVE

## 2018-12-08 LAB — POCT FERN TEST: POCT Fern Test: POSITIVE

## 2018-12-08 MED ORDER — SOD CITRATE-CITRIC ACID 500-334 MG/5ML PO SOLN: 30.0000 mL | ORAL | Status: DC | PRN

## 2018-12-08 MED ORDER — OXYTOCIN 40 UNITS IN LACTATED RINGERS INFUSION - SIMPLE MED
1.0000 m[IU]/min | INTRAVENOUS | Status: DC
  Administered 2018-12-08: 2 m[IU]/min via INTRAVENOUS
  Filled 2018-12-08: qty 1000

## 2018-12-08 MED ORDER — PRENATAL MULTIVITAMIN CH
1.0000 | ORAL_TABLET | Freq: Every day | ORAL | Status: DC
  Administered 2018-12-09: 1 via ORAL
  Filled 2018-12-08: qty 1

## 2018-12-08 MED ORDER — BUTORPHANOL TARTRATE 2 MG/ML IJ SOLN: 2.0000 mg | INTRAMUSCULAR | Status: DC | PRN

## 2018-12-08 MED ORDER — OXYCODONE-ACETAMINOPHEN 5-325 MG PO TABS: 1.0000 | ORAL_TABLET | ORAL | Status: DC | PRN

## 2018-12-08 MED ORDER — OXYTOCIN 40 UNITS IN LACTATED RINGERS INFUSION - SIMPLE MED
2.5000 [IU]/h | INTRAVENOUS | Status: DC
  Administered 2018-12-08: 2.5 [IU]/h via INTRAVENOUS

## 2018-12-08 MED ORDER — OXYTOCIN 10 UNIT/ML IJ SOLN: 10.0000 [IU] | Freq: Once | INTRAMUSCULAR | Status: DC

## 2018-12-08 MED ORDER — IBUPROFEN 600 MG PO TABS
600.0000 mg | ORAL_TABLET | Freq: Four times a day (QID) | ORAL | Status: DC
  Administered 2018-12-08 – 2018-12-10 (×7): 600 mg via ORAL
  Filled 2018-12-08 (×7): qty 1

## 2018-12-08 MED ORDER — ACETAMINOPHEN 325 MG PO TABS: 650.0000 mg | ORAL_TABLET | ORAL | Status: DC | PRN

## 2018-12-08 MED ORDER — ONDANSETRON HCL 4 MG/2ML IJ SOLN: 4.0000 mg | INTRAMUSCULAR | Status: DC | PRN

## 2018-12-08 MED ORDER — ONDANSETRON HCL 4 MG PO TABS: 4.0000 mg | ORAL_TABLET | ORAL | Status: DC | PRN

## 2018-12-08 MED ORDER — OXYCODONE-ACETAMINOPHEN 5-325 MG PO TABS: 2.0000 | ORAL_TABLET | ORAL | Status: DC | PRN

## 2018-12-08 MED ORDER — PHENYLEPHRINE 40 MCG/ML (10ML) SYRINGE FOR IV PUSH (FOR BLOOD PRESSURE SUPPORT)
80.0000 ug | PREFILLED_SYRINGE | INTRAVENOUS | Status: DC | PRN
  Filled 2018-12-08: qty 10

## 2018-12-08 MED ORDER — COCONUT OIL OIL
1.0000 "application " | TOPICAL_OIL | Status: DC | PRN
  Administered 2018-12-10: 1 via TOPICAL
  Filled 2018-12-08: qty 120

## 2018-12-08 MED ORDER — DIPHENHYDRAMINE HCL 25 MG PO CAPS: 25.0000 mg | ORAL_CAPSULE | Freq: Four times a day (QID) | ORAL | Status: DC | PRN

## 2018-12-08 MED ORDER — LACTATED RINGERS IV SOLN: 500.0000 mL | Freq: Once | INTRAVENOUS | Status: DC

## 2018-12-08 MED ORDER — METHYLERGONOVINE MALEATE 0.2 MG/ML IJ SOLN
INTRAMUSCULAR | Status: AC
  Administered 2018-12-08: 0.2 mg
  Filled 2018-12-08: qty 1

## 2018-12-08 MED ORDER — METHYLERGONOVINE MALEATE 0.2 MG/ML IJ SOLN: 0.2000 mg | INTRAMUSCULAR | Status: DC | PRN

## 2018-12-08 MED ORDER — METHYLERGONOVINE MALEATE 0.2 MG/ML IJ SOLN: 0.2000 mg | Freq: Once | INTRAMUSCULAR | Status: AC

## 2018-12-08 MED ORDER — WITCH HAZEL-GLYCERIN EX PADS: 1.0000 "application " | MEDICATED_PAD | CUTANEOUS | Status: DC | PRN

## 2018-12-08 MED ORDER — METHYLERGONOVINE MALEATE 0.2 MG PO TABS: 0.2000 mg | ORAL_TABLET | ORAL | Status: DC | PRN

## 2018-12-08 MED ORDER — FENTANYL 2.5 MCG/ML BUPIVACAINE 1/10 % EPIDURAL INFUSION (WH - ANES)
INTRAMUSCULAR | Status: AC
  Filled 2018-12-08: qty 100

## 2018-12-08 MED ORDER — DIBUCAINE 1 % RE OINT: 1.0000 "application " | TOPICAL_OINTMENT | RECTAL | Status: DC | PRN

## 2018-12-08 MED ORDER — FENTANYL 2.5 MCG/ML BUPIVACAINE 1/10 % EPIDURAL INFUSION (WH - ANES)
14.0000 mL/h | INTRAMUSCULAR | Status: DC | PRN
  Administered 2018-12-08: 14 mL/h via EPIDURAL

## 2018-12-08 MED ORDER — EPHEDRINE 5 MG/ML INJ
10.0000 mg | INTRAVENOUS | Status: DC | PRN
  Filled 2018-12-08: qty 2

## 2018-12-08 MED ORDER — SENNOSIDES-DOCUSATE SODIUM 8.6-50 MG PO TABS
2.0000 | ORAL_TABLET | ORAL | Status: DC
  Administered 2018-12-09 – 2018-12-10 (×2): 2 via ORAL
  Filled 2018-12-08 (×2): qty 2

## 2018-12-08 MED ORDER — EPHEDRINE 5 MG/ML INJ: 10.0000 mg | INTRAVENOUS | Status: DC | PRN

## 2018-12-08 MED ORDER — DIPHENHYDRAMINE HCL 50 MG/ML IJ SOLN: 12.5000 mg | INTRAMUSCULAR | Status: DC | PRN

## 2018-12-08 MED ORDER — LIDOCAINE HCL (PF) 1 % IJ SOLN
30.0000 mL | INTRAMUSCULAR | Status: DC | PRN
  Administered 2018-12-08: 30 mL via SUBCUTANEOUS
  Filled 2018-12-08: qty 30

## 2018-12-08 MED ORDER — LIDOCAINE HCL (PF) 1 % IJ SOLN
INTRAMUSCULAR | Status: DC | PRN
  Administered 2018-12-08: 5 mL via EPIDURAL

## 2018-12-08 MED ORDER — PHENYLEPHRINE 40 MCG/ML (10ML) SYRINGE FOR IV PUSH (FOR BLOOD PRESSURE SUPPORT): 80.0000 ug | PREFILLED_SYRINGE | INTRAVENOUS | Status: DC | PRN

## 2018-12-08 MED ORDER — ZOLPIDEM TARTRATE 5 MG PO TABS: 5.0000 mg | ORAL_TABLET | Freq: Every evening | ORAL | Status: DC | PRN

## 2018-12-08 MED ORDER — IBUPROFEN 800 MG PO TABS
800.0000 mg | ORAL_TABLET | Freq: Once | ORAL | Status: AC
  Administered 2018-12-08: 800 mg via ORAL
  Filled 2018-12-08: qty 1

## 2018-12-08 MED ORDER — OXYTOCIN BOLUS FROM INFUSION
500.0000 mL | Freq: Once | INTRAVENOUS | Status: AC
  Administered 2018-12-08: 500 mL via INTRAVENOUS

## 2018-12-08 MED ORDER — SIMETHICONE 80 MG PO CHEW: 80.0000 mg | CHEWABLE_TABLET | ORAL | Status: DC | PRN

## 2018-12-08 MED ORDER — BENZOCAINE-MENTHOL 20-0.5 % EX AERO
1.0000 "application " | INHALATION_SPRAY | CUTANEOUS | Status: DC | PRN
  Filled 2018-12-08: qty 56

## 2018-12-08 MED ORDER — LACTATED RINGERS IV SOLN
INTRAVENOUS | Status: DC
  Administered 2018-12-08: 03:00:00 via INTRAVENOUS

## 2018-12-08 MED ORDER — TERBUTALINE SULFATE 1 MG/ML IJ SOLN
0.2500 mg | Freq: Once | INTRAMUSCULAR | Status: DC | PRN
  Filled 2018-12-08: qty 1

## 2018-12-08 MED ORDER — ONDANSETRON HCL 4 MG/2ML IJ SOLN: 4.0000 mg | Freq: Four times a day (QID) | INTRAMUSCULAR | Status: DC | PRN

## 2018-12-08 MED ORDER — LACTATED RINGERS IV SOLN: 500.0000 mL | INTRAVENOUS | Status: DC | PRN

## 2018-12-08 MED ORDER — PHENYLEPHRINE 40 MCG/ML (10ML) SYRINGE FOR IV PUSH (FOR BLOOD PRESSURE SUPPORT)
PREFILLED_SYRINGE | INTRAVENOUS | Status: AC
  Filled 2018-12-08: qty 20

## 2018-12-08 NOTE — Anesthesia Postprocedure Evaluation (Signed)
Anesthesia Post Note  Patient: Ann Henry  Procedure(s) Performed: AN AD HOC LABOR EPIDURAL     Patient location during evaluation: Mother Baby Anesthesia Type: Epidural Level of consciousness: awake and alert Pain management: pain level controlled Vital Signs Assessment: post-procedure vital signs reviewed and stable Respiratory status: spontaneous breathing, nonlabored ventilation and respiratory function stable Cardiovascular status: stable Postop Assessment: no headache, no backache and epidural receding Anesthetic complications: no    Last Vitals:  Vitals:   12/08/18 1539 12/08/18 1943  BP: 110/65 114/69  Pulse: 70 64  Resp:  18  Temp: 36.8 C 36.7 C  SpO2: 98% 98%    Last Pain:  Vitals:   12/08/18 1943  TempSrc: Oral  PainSc:    Pain Goal: Patients Stated Pain Goal: 0 (12/08/18 0226)               Junious SilkGILBERT,Janann Boeve

## 2018-12-08 NOTE — Lactation Note (Signed)
This note was copied from a baby's chart. Lactation Consultation Note  Patient Name: Ann Henry ZOXWR'UToday's Date: 12/08/2018 Reason for consult: Initial assessment;Term;Other (Comment)(humerous fracture)  8 hours old FT female who is being exclusively BF by his mother, she's a P2 and experienced BF. She was able to BF her first child for 8 months but experienced some BF difficulties due to a shallow latch.  Mom is familiar with hand expression, when revising hand expression, she was able to get a big drop of colostrum out of her left breast, LC rubbed it on baby's mouth with mom's permission. She has a Medela DEBP at home.  Baby was asleep when entering the room, offered assistance with latch but mom politely declined, baby has a humerus fracture and mom is being careful about positioning baby for feedings at the breast, baby fed about an hour ago and she did not want to disturb him. LC respected mom's wishes and asked her to call for assistance when needed. Per mom feedings at the breast are comfortable and she's able to hear baby swallowing; both of her nipples looked intact upon examination. Discussed cluster feeding, feeding cues and normal newborn sleeping cycle. LC also reviewed tips for a deep latch, noticed that baby has a recessed chin.  Feeding plan  1. Encouraged mom to feed baby STS 8-12 times/24 hours or sooner if feeding cues are present 2. Hand expression and finger/spoon feeding was also encouraged  BF brochure, BF resources and feeding diary were also discussed. Parents reported all questions and concerns were answered, they're both aware of LC services and will call PRN.  Maternal Data Formula Feeding for Exclusion: No Has patient been taught Hand Expression?: Yes Does the patient have breastfeeding experience prior to this delivery?: Yes  Feeding   Interventions Interventions: Breast feeding basics reviewed;Breast massage;Hand express;Breast compression  Lactation Tools  Discussed/Used WIC Program: No   Consult Status Consult Status: Follow-up Date: 12/09/18 Follow-up type: In-patient    Oakes Mccready Venetia ConstableS Ulmer Degen 12/08/2018, 8:37 PM

## 2018-12-08 NOTE — Anesthesia Preprocedure Evaluation (Signed)
Anesthesia Evaluation  Patient identified by MRN, date of birth, ID band Patient awake    Reviewed: Allergy & Precautions, Patient's Chart, lab work & pertinent test results  Airway Mallampati: I       Dental no notable dental hx.    Pulmonary    Pulmonary exam normal        Cardiovascular negative cardio ROS Normal cardiovascular exam     Neuro/Psych negative neurological ROS     GI/Hepatic negative GI ROS, Neg liver ROS,   Endo/Other  negative endocrine ROS  Renal/GU negative Renal ROS     Musculoskeletal   Abdominal   Peds  Hematology   Anesthesia Other Findings   Reproductive/Obstetrics (+) Pregnancy                             Anesthesia Physical Anesthesia Plan  ASA: II  Anesthesia Plan: Epidural   Post-op Pain Management:    Induction:   PONV Risk Score and Plan:   Airway Management Planned: Natural Airway  Additional Equipment:   Intra-op Plan:   Post-operative Plan:   Informed Consent: I have reviewed the patients History and Physical, chart, labs and discussed the procedure including the risks, benefits and alternatives for the proposed anesthesia with the patient or authorized representative who has indicated his/her understanding and acceptance.     Plan Discussed with:   Anesthesia Plan Comments: (Lab Results      Component                Value               Date                      WBC                      16.6 (H)            12/08/2018                HGB                      13.4                12/08/2018                HCT                      39.7                12/08/2018                MCV                      98.0                12/08/2018                PLT                      189                 12/08/2018           )        Anesthesia Quick Evaluation

## 2018-12-08 NOTE — H&P (Signed)
Ann Henry is a 37 y.o. female presenting at term with SROM since 11pm. Clear fluid. (+) ctx. GBS cx neg. Uncomplicated PNC OB History    Gravida  3   Para  1   Term  1   Preterm      AB  1   Living  1     SAB  1   TAB      Ectopic      Multiple  0   Live Births  1          Past Medical History:  Diagnosis Date  . Acute blood loss anemia 09/20/2015  . Full-term premature rupture of membranes 09/18/2015  . Sinus infection   . Vaginal bleeding in pregnant patient at less than [redacted] weeks gestation 01/30/2015   Past Surgical History:  Procedure Laterality Date  . dental implant    . MOUTH SURGERY    . NASAL POLYP SURGERY    . WISDOM TOOTH EXTRACTION     Family History: family history includes Heart disease in her father; Hypertension in her mother. Social History:  reports that she has never smoked. She has never used smokeless tobacco. She reports current alcohol use. She reports that she does not use drugs.     Maternal Diabetes: No Genetic Screening: Normal Maternal Ultrasounds/Referrals: Normal Fetal Ultrasounds or other Referrals:  None Maternal Substance Abuse:  No Significant Maternal Medications:  None Significant Maternal Lab Results:  Lab values include: Group B Strep negative Other Comments:  AMA  Review of Systems  All other systems reviewed and are negative.  Maternal Medical History:  Reason for admission: Rupture of membranes and contractions.   Contractions: Onset was 13-24 hours ago.    Fetal activity: Perceived fetal activity is normal.    Prenatal complications: no prenatal complications Prenatal Complications - Diabetes: none.    Dilation: 3.5 Effacement (%): 70 Station: -1 Exam by:: Quintella BatonJo Barham RN Blood pressure 129/86, pulse 67, temperature 98 F (36.7 C), temperature source Oral, resp. rate (!) 22, height 5\' 6"  (1.676 m), weight 79.8 kg, unknown if currently breastfeeding. Maternal Exam:  Uterine Assessment: Contraction  strength is moderate.  Contraction frequency is irregular.   Abdomen: Patient reports no abdominal tenderness. Fetal presentation: vertex  Introitus: Ferning test: positive.  Amniotic fluid character: clear.  Pelvis: adequate for delivery.   Cervix: Cervix evaluated by digital exam.     Physical Exam  Constitutional: She is oriented to person, place, and time. She appears well-developed and well-nourished.  HENT:  Head: Atraumatic.  Eyes: EOM are normal.  Neck: Neck supple.  Cardiovascular: Normal rate.  Respiratory: Effort normal.  GI: Soft.  Musculoskeletal:        General: Edema present.  Neurological: She is alert and oriented to person, place, and time.  Skin: Skin is warm and dry.  Psychiatric: She has a normal mood and affect.    Prenatal labs: ABO, Rh:  O positive Antibody:  neg Rubella:  Immune RPR:   NR HBsAg:   neg HIV:   neg GBS:   neg  Assessment/Plan: Early labor  term gestation P) admit routine labs. Pitocin augmentation prn. Analgesic/epidural prn   Ann Henry 12/08/2018, 3:55 AM

## 2018-12-08 NOTE — Anesthesia Procedure Notes (Signed)
Epidural Patient location during procedure: OB Start time: 12/08/2018 7:34 AM End time: 12/08/2018 7:40 AM  Staffing Anesthesiologist: Shelton SilvasHollis, Tiyonna Sardinha D, MD Performed: anesthesiologist   Preanesthetic Checklist Completed: patient identified, site marked, surgical consent, pre-op evaluation, timeout performed, IV checked, risks and benefits discussed and monitors and equipment checked  Epidural Patient position: sitting Prep: ChloraPrep Patient monitoring: heart rate, continuous pulse ox and blood pressure Approach: midline Location: L3-L4 Injection technique: LOR saline  Needle:  Needle type: Tuohy  Needle gauge: 17 G Needle length: 9 cm Catheter type: closed end flexible Catheter size: 20 Guage Test dose: negative and 1.5% lidocaine  Assessment Events: blood not aspirated, injection not painful, no injection resistance and no paresthesia  Additional Notes LOR @ 5  Patient identified. Risks/Benefits/Options discussed with patient including but not limited to bleeding, infection, nerve damage, paralysis, failed block, incomplete pain control, headache, blood pressure changes, nausea, vomiting, reactions to medications, itching and postpartum back pain. Confirmed with bedside nurse the patient's most recent platelet count. Confirmed with patient that they are not currently taking any anticoagulation, have any bleeding history or any family history of bleeding disorders. Patient expressed understanding and wished to proceed. All questions were answered. Sterile technique was used throughout the entire procedure. Please see nursing notes for vital signs. Test dose was given through epidural catheter and negative prior to continuing to dose epidural or start infusion. Warning signs of high block given to the patient including shortness of breath, tingling/numbness in hands, complete motor block, or any concerning symptoms with instructions to call for help. Patient was given instructions  on fall risk and not to get out of bed. All questions and concerns addressed with instructions to call with any issues or inadequate analgesia.    Reason for block:procedure for pain

## 2018-12-08 NOTE — Anesthesia Pain Management Evaluation Note (Signed)
  CRNA Pain Management Visit Note  Patient: Ann Henry, 37 y.o., female  "Hello I am a member of the anesthesia team at [facility]. We have an anesthesia team available at all times to provide care throughout the hospital, including epidural management and anesthesia for C-section. I don't know your plan for the delivery whether it a natural birth, water birth, IV sedation, nitrous supplementation, doula or epidural, but we want to meet your pain goals."   1.Was your pain managed to your expectations on prior hospitalizations?   Yes   2.What is your expectation for pain management during this hospitalization?     Epidural  3.How can we help you reach that goal? Epidural in place at time of visit  Record the patient's initial score and the patient's pain goal.   Pain: 0  Pain Goal: 5 [facility] wants you to be able to say your pain was always managed very well.  Rica RecordsICKELTON,Ann Henry 12/08/2018

## 2018-12-08 NOTE — Progress Notes (Signed)
S:  Pt. Feeling urge to push. On 6 milliunits of Pitocin   O:  VS: Blood pressure 124/76, pulse 96, temperature 97.9 F (36.6 C), temperature source Oral, resp. rate 20, height 5\' 6"  (1.676 m), weight 79.8 kg, SpO2 100 %, unknown if currently breastfeeding.        FHR : baseline 135 bpm / variability moderate / accelerations + / occasional variable  And early decelerations relieved by position changes         Toco: contractions every 2-4 minutes / moderate-strong         Cervix : 9.5/100/+1        Membranes: SROM- clear fluid bloody show  A: Active labor     FHR category 2     GBS Negative  P: On my exam, anterior lip reduced with 2 pushes      Continue pushing     Anticipate NSVD    Carlean JewsMeredith Kashara Blocher, MSN, CNM [facility]

## 2018-12-08 NOTE — Progress Notes (Signed)
Dr Cherly Hensenousins notified of pt's admission and status. Aware of sve, SROM with cl fld at 2300. [facility]

## 2018-12-08 NOTE — Progress Notes (Addendum)
Called by RN to come assess continuous trickle of bleeding. Reported large gush into pad on first fundal check, no clots.  I assess bilateral labia minora lacs and they are hemostatic. Vaginal mucosal abrasion is also hemostatic. Fundus is firm U-E with trickle of dark red bleeding from uterus. Sterile gloves donned and bimanual performed. Lower uterine segment feels firm and one small clot expressed. Bladder drained using sterile I&O cath in standard fashion. 50mL of yellow urine drained. Methergine 0.2mg  given IM x 1. Fundal massage and bimanual performed again with no further trickle.  Will continue to monitor closely. VSS. Continue Methergine course. Dr. Cherly Hensenousins and Dr. Billy Coastaavon updated.  Ann Henry, CNM

## 2018-12-08 NOTE — Progress Notes (Signed)
Discussed pain relief and epidural placement with patient and dr. Cherly Hensenousins. Plan to wait two hours then get the epidural. After epidural, pitocin will be started to Augment per dr. Cherly Hensenousins.

## 2018-12-08 NOTE — Progress Notes (Signed)
I am assuming care from Dr. Cherly Hensenousins.  Per Dr. Billy Coastaavon, CNM management in his absence.  S:  Feeling some cramping and pressure. Epidural PCA used after cervical exam.  Has been rotating exaggerated sims positions with peanut.   O:  VS: Blood pressure 115/74, pulse 63, temperature 98 F (36.7 C), temperature source Oral, resp. rate 18, height 5\' 6"  (1.676 m), weight 79.8 kg, SpO2 100 %, unknown if currently breastfeeding.        FHR : baseline 130 bpm / variability moderate / accelerations +15x15 / no decelerations/ +scalp stimulation         Toco: contractions every 2-4 minutes / moderate         Cervix : Dilation: 6 Effacement (%): 90 Cervical Position: Anterior Station: 0 Presentation: Vertex Exam by:: Sharyn DrossM. Sigmn, CNM Asynclitic ROP - caput noted         Membranes: SROM - clear fluid with bloody show  A: Transitioning to active labor     FHR category 1     GBS Negative   P: Continue exaggerated Sims position with peanut ball       Discussed IUPC in the next few hours if minimal cervical change       Continue Pitocin augmentation       Reassess in 1-2 hrs       Anticipate NSVD     Carlean JewsMeredith Rowe Warman, MSN, CNM [facility]

## 2018-12-08 NOTE — MAU Note (Signed)
Ctxs since 2300. Leaking some fld since then as well. No bleeding. 1.5cm last sve

## 2018-12-08 NOTE — Progress Notes (Addendum)
S: breathing with ctx  O. BP 129/86   Pulse 67   Temp 98 F (36.7 C) (Oral)   Resp (!) 22   Ht 5\' 6"  (1.676 m)   Wt 79.8 kg   BMI 28.41 kg/m  VE 4/70/-2 posterior lateral Asynclitic  Tracing; reactive baseline 140 (+)accels  ctx q 5-6 mins  IMP: latent phase Term gestation P) epidural. Pitocin augmentation  Addendum . P) wait until 7 am for epidural and pitocin augmentation. Offered analgesic/nitrous oxide . Pt declined

## 2018-12-09 LAB — CBC
HCT: 33.5 % — ABNORMAL LOW (ref 36.0–46.0)
Hemoglobin: 11.3 g/dL — ABNORMAL LOW (ref 12.0–15.0)
MCH: 33.6 pg (ref 26.0–34.0)
MCHC: 33.7 g/dL (ref 30.0–36.0)
MCV: 99.7 fL (ref 80.0–100.0)
NRBC: 0 % (ref 0.0–0.2)
Platelets: 148 10*3/uL — ABNORMAL LOW (ref 150–400)
RBC: 3.36 MIL/uL — ABNORMAL LOW (ref 3.87–5.11)
RDW: 13.3 % (ref 11.5–15.5)
WBC: 13.1 10*3/uL — ABNORMAL HIGH (ref 4.0–10.5)

## 2018-12-09 LAB — RPR: RPR Ser Ql: NONREACTIVE

## 2018-12-09 NOTE — Progress Notes (Signed)
PPD 1 SVD  S:  Reports feeling ok - little tired - wants DC tomorrow after circ             Tolerating po/ No nausea or vomiting             Bleeding is light             Pain controlled with Motrin             Up ad lib / ambulatory / voiding QS  Newborn Breast / Circumcision requested prior to DC  O:      VS: BP 95/62 (BP Location: Left Arm)   Pulse 64   Temp 97.7 F (36.5 C) (Oral)   Resp 18   Ht 5\' 6"  (1.676 m)   Wt 79.8 kg   SpO2 98%   Breastfeeding Unknown   BMI 28.41 kg/m    LABS:             Recent Labs    12/08/18 0300 12/09/18 0523  WBC 16.6* 13.1*  HGB 13.4 11.3*  PLT 189 148*               Blood type: --/--/O POS (12/22 0300)  Rubella: Immune (05/20 0000)                     I&O: Intake/Output      12/22 0701 - 12/23 0700 12/23 0701 - 12/24 0700   Urine (mL/kg/hr) 500 (0.3)    Blood 269    Total Output 769    Net -769                     Physical Exam:             Alert and oriented X3             Fundus: firm, non-tender, Ueven  Perineum: intact  Lochia: light  Extremities: no edema, no calf pain or tenderness    A: PPD # 1   Doing well - stable status  P: Routine post partum orders  circ prior to DC             DC in AM   Marlinda Mikeanya Chrisangel Eskenazi CNM, MSN, [facility] 12/09/2018, 8:37 AM

## 2018-12-10 MED ORDER — COCONUT OIL OIL: 1.0000 "application " | TOPICAL_OIL | 0 refills | Status: AC | PRN

## 2018-12-10 MED ORDER — BENZOCAINE-MENTHOL 20-0.5 % EX AERO: 1.0000 "application " | INHALATION_SPRAY | CUTANEOUS | Status: AC | PRN

## 2018-12-10 MED ORDER — IBUPROFEN 600 MG PO TABS: 600.0000 mg | ORAL_TABLET | Freq: Four times a day (QID) | ORAL | 0 refills | Status: AC

## 2018-12-10 MED ORDER — ACETAMINOPHEN 325 MG PO TABS: 650.0000 mg | ORAL_TABLET | ORAL | Status: AC | PRN

## 2018-12-10 NOTE — Lactation Note (Signed)
This note was copied from a baby's chart. Lactation Consultation Note  Patient Name: Ann Henry ZOXWR'UToday's Date: 12/10/2018    "Ann Henry" (at 9546 hours old) is nursing very well. His suck:swallow ratio is often 1:1. When hand expression was done, Mom yielded colostrum and what appeared to be transitional milk.   Three years ago, with Mom's 1st child, Mom's milk was slow to come in. It took almost a week for her to be able to pump 2 oz. Per Mom, it took another couple of weeks before her supply was fully realized. In Mom's chart, it does note a diagnosis of "acute blood loss anemia" on 09-20-15, which is the day after she birthed her 1st child, [name]. I was unable to see how much blood was lost with that delivery, but Mom did not remember anything unusual. Mom also reports that it took a while for her 1st child to regain birth weight & she received donor milk for supplementing her.   I feel that Mom will have a different trajectory with lactogenesis II with this infant, as Mom reported increased veining during pregnancy; blood loss for Ann Henry's delivery was only 150mL; and some transitional milk seems to be present. However, given Mom's history, it seemed prudent for Mom to add in a little bit of pumping. Since infant seems to  satiate himself at the breast, Mom can use any milk she expresses at night for when he is cluster feeding and/or begin to store it.  Mom has our # to call us for any post-discharge questions.  Lurline HareRichey, [name] [facility] 12/10/2018, 10:19 AM

## 2018-12-10 NOTE — Discharge Summary (Addendum)
OB Discharge Summary  Patient Name: Ann Henry DOB: [birth year] MRN: [id number]  Date of admission: 12/08/2018 Delivering provider: Carlean JewsSIGMON, MEREDITH C   Date of discharge: 12/10/2018  Admitting diagnosis: 39 wks ctx and pressure Intrauterine pregnancy: 223w6d     Secondary diagnosis:Principal Problem:   Postpartum care following vaginal delivery (12/22) Active Problems:   Indication for care in labor or delivery   SVD (spontaneous vaginal delivery)  Additional problems:none    Discharge diagnosis:  Patient Active Problem List   Diagnosis Date Noted  . Indication for care in labor or delivery 12/08/2018  . SVD (spontaneous vaginal delivery) 12/08/2018  . Postpartum care following vaginal delivery (12/22) 12/08/2018                                                                Post partum procedures:none  Augmentation: Pitocin Pain control: Epidural  Laceration:Labial  Episiotomy:None  Complications: newborn R humerus fracture at birth   Hospital course:  Onset of Labor With Vaginal Delivery     37 y.o. yo [id number] at 6223w6d was [facility]. Patient had an uncomplicated labor course as follows:  Membrane Rupture Time/Date: 11:00 PM ,12/07/2018   Intrapartum Procedures: Episiotomy: None [1]                                         Lacerations:  Labial [10]  Patient had a delivery of a Viable infant. 12/08/2018  Information for the patient's newborn:  Terance IceRaif, Boy Kizzi [[id number]][[id number]]  Delivery Method: Vag-Spont    Pateint had an uncomplicated postpartum course.  She is ambulating, tolerating a regular diet, passing flatus, and urinating well. Patient is discharged home in stable condition on 12/10/18.   Physical exam  Vitals:   12/09/18 0600 12/09/18 1356 12/09/18 2157 12/10/18 0614  BP: 95/62 (!) 89/57 111/70 (!) 116/59  Pulse: 64 (!) 57 64   Resp: 18 16 16 16   Temp: 97.7 F (36.5 C) 98 F (36.7 C) (!)  97.5 F (36.4 C)   TempSrc: Oral  Oral   SpO2: 98%  98%   Weight:      Height:       General: alert, cooperative and no distress Lochia: appropriate Uterine Fundus: firm Incision: Healing well with no significant drainage DVT Evaluation: No cords or calf tenderness. No significant calf/ankle edema. Labs: Lab Results  Component Value Date   WBC 13.1 (H) 12/09/2018   HGB 11.3 (L) 12/09/2018   HCT 33.5 (L) 12/09/2018   MCV 99.7 12/09/2018   PLT 148 (L) 12/09/2018   No flowsheet data found.  Vaccines: TDaP UTD         Flu    declined  Discharge instruction: per After Visit Summary and "Baby and Me Booklet".  After Visit Meds:  Allergies as of 12/10/2018   No Known Allergies     Medication List    TAKE these medications   acetaminophen 325 MG tablet Commonly known as:  TYLENOL Take 2 tablets (650 mg total) by mouth every 4 (four) hours as needed (for pain scale < 4).   benzocaine-Menthol 20-0.5 % Aero Commonly known as:  DERMOPLAST  Apply 1 application topically as needed for irritation (perineal discomfort).   coconut oil Oil Apply 1 application topically as needed.   ibuprofen 600 MG tablet Commonly known as:  ADVIL,MOTRIN Take 1 tablet (600 mg total) by mouth every 6 (six) hours.   JUICE PLUS FIBRE PO Take 2 tablets by mouth daily.   prenatal multivitamin Tabs tablet Take 1 tablet by mouth daily at 12 noon.            Discharge Care Instructions  (From admission, onward)         Start     Ordered   12/10/18 0000  Discharge wound care:    Comments:  Sitz baths 2 times /day with warm water x 1 week   12/10/18 0953          Diet: routine diet  Activity: Advance as tolerated. Pelvic rest for 6 weeks.   Postpartum contraception: Not Discussed  Newborn Data: Live born female  Birth Weight: 8 lb 2.5 oz (3700 g) APGAR: 9, 9  Newborn Delivery   Birth date/time:  12/08/2018 12:17:00 Delivery type:  Vaginal, Spontaneous     named Arlo Baby  Feeding: Breast Disposition:home with mother Baby with humerus fracture  Delivery Report:  Review the Delivery Report for details.    Follow up: Follow-up Information    Olivia Mackieaavon, Richard, MD. Schedule an appointment as soon as possible for a visit in 6 week(s).   Specialty:  Obstetrics and Gynecology Contact information: [address] [place] [id number] [phone]             Signed: Neta MendsDaniela C Paul, CNM, MSN 12/10/2018, 10:00 AM

## 2019-01-28 DIAGNOSIS — Z124 Encounter for screening for malignant neoplasm of cervix: Secondary | ICD-10-CM | POA: Diagnosis not present

## 2019-01-28 DIAGNOSIS — Z1151 Encounter for screening for human papillomavirus (HPV): Secondary | ICD-10-CM | POA: Diagnosis not present

## 2019-02-11 DIAGNOSIS — J019 Acute sinusitis, unspecified: Secondary | ICD-10-CM | POA: Diagnosis not present

## 2019-09-30 DIAGNOSIS — Z0000 Encounter for general adult medical examination without abnormal findings: Secondary | ICD-10-CM | POA: Diagnosis not present

## 2020-01-06 DIAGNOSIS — D225 Melanocytic nevi of trunk: Secondary | ICD-10-CM | POA: Diagnosis not present

## 2020-01-06 DIAGNOSIS — D1801 Hemangioma of skin and subcutaneous tissue: Secondary | ICD-10-CM | POA: Diagnosis not present

## 2020-01-06 DIAGNOSIS — L853 Xerosis cutis: Secondary | ICD-10-CM | POA: Diagnosis not present

## 2020-01-06 DIAGNOSIS — L738 Other specified follicular disorders: Secondary | ICD-10-CM | POA: Diagnosis not present

## 2020-03-18 DIAGNOSIS — Z6822 Body mass index (BMI) 22.0-22.9, adult: Secondary | ICD-10-CM | POA: Diagnosis not present

## 2020-03-18 DIAGNOSIS — Z01419 Encounter for gynecological examination (general) (routine) without abnormal findings: Secondary | ICD-10-CM | POA: Diagnosis not present

## 2020-04-06 DIAGNOSIS — B001 Herpesviral vesicular dermatitis: Secondary | ICD-10-CM | POA: Diagnosis not present

## 2020-05-03 DIAGNOSIS — R5383 Other fatigue: Secondary | ICD-10-CM | POA: Diagnosis not present

## 2020-05-03 DIAGNOSIS — J029 Acute pharyngitis, unspecified: Secondary | ICD-10-CM | POA: Diagnosis not present

## 2020-05-03 DIAGNOSIS — Z20822 Contact with and (suspected) exposure to covid-19: Secondary | ICD-10-CM | POA: Diagnosis not present

## 2020-05-11 DIAGNOSIS — J0100 Acute maxillary sinusitis, unspecified: Secondary | ICD-10-CM | POA: Diagnosis not present

## 2020-06-16 DIAGNOSIS — J3489 Other specified disorders of nose and nasal sinuses: Secondary | ICD-10-CM | POA: Diagnosis not present

## 2020-07-12 DIAGNOSIS — Z20822 Contact with and (suspected) exposure to covid-19: Secondary | ICD-10-CM | POA: Diagnosis not present

## 2020-09-30 DIAGNOSIS — J00 Acute nasopharyngitis [common cold]: Secondary | ICD-10-CM | POA: Diagnosis not present

## 2020-10-01 DIAGNOSIS — Z03818 Encounter for observation for suspected exposure to other biological agents ruled out: Secondary | ICD-10-CM | POA: Diagnosis not present

## 2020-10-01 DIAGNOSIS — J00 Acute nasopharyngitis [common cold]: Secondary | ICD-10-CM | POA: Diagnosis not present

## 2020-12-20 DIAGNOSIS — U071 COVID-19: Secondary | ICD-10-CM | POA: Diagnosis not present

## 2020-12-21 DIAGNOSIS — U071 COVID-19: Secondary | ICD-10-CM | POA: Diagnosis not present

## 2020-12-21 DIAGNOSIS — B349 Viral infection, unspecified: Secondary | ICD-10-CM | POA: Diagnosis not present

## 2021-03-04 DIAGNOSIS — Z9109 Other allergy status, other than to drugs and biological substances: Secondary | ICD-10-CM | POA: Diagnosis not present

## 2021-03-04 DIAGNOSIS — J019 Acute sinusitis, unspecified: Secondary | ICD-10-CM | POA: Diagnosis not present

## 2021-03-23 DIAGNOSIS — J029 Acute pharyngitis, unspecified: Secondary | ICD-10-CM | POA: Diagnosis not present

## 2021-03-23 DIAGNOSIS — J329 Chronic sinusitis, unspecified: Secondary | ICD-10-CM | POA: Diagnosis not present

## 2021-03-31 DIAGNOSIS — Z8709 Personal history of other diseases of the respiratory system: Secondary | ICD-10-CM | POA: Diagnosis not present

## 2021-03-31 DIAGNOSIS — J3489 Other specified disorders of nose and nasal sinuses: Secondary | ICD-10-CM | POA: Diagnosis not present

## 2021-10-03 DIAGNOSIS — J0101 Acute recurrent maxillary sinusitis: Secondary | ICD-10-CM | POA: Diagnosis not present

## 2021-11-07 DIAGNOSIS — R0981 Nasal congestion: Secondary | ICD-10-CM | POA: Diagnosis not present

## 2021-11-07 DIAGNOSIS — J0100 Acute maxillary sinusitis, unspecified: Secondary | ICD-10-CM | POA: Diagnosis not present

## 2021-11-07 DIAGNOSIS — Z03818 Encounter for observation for suspected exposure to other biological agents ruled out: Secondary | ICD-10-CM | POA: Diagnosis not present

## 2021-11-07 DIAGNOSIS — J069 Acute upper respiratory infection, unspecified: Secondary | ICD-10-CM | POA: Diagnosis not present

## 2021-11-07 DIAGNOSIS — R509 Fever, unspecified: Secondary | ICD-10-CM | POA: Diagnosis not present

## 2021-12-05 DIAGNOSIS — J019 Acute sinusitis, unspecified: Secondary | ICD-10-CM | POA: Diagnosis not present

## 2021-12-05 DIAGNOSIS — Z9109 Other allergy status, other than to drugs and biological substances: Secondary | ICD-10-CM | POA: Diagnosis not present

## 2022-03-18 DIAGNOSIS — J301 Allergic rhinitis due to pollen: Secondary | ICD-10-CM | POA: Diagnosis not present

## 2022-06-04 DIAGNOSIS — J029 Acute pharyngitis, unspecified: Secondary | ICD-10-CM | POA: Diagnosis not present

## 2022-06-04 DIAGNOSIS — J019 Acute sinusitis, unspecified: Secondary | ICD-10-CM | POA: Diagnosis not present

## 2022-08-24 DIAGNOSIS — J0141 Acute recurrent pansinusitis: Secondary | ICD-10-CM | POA: Diagnosis not present

## 2022-10-04 DIAGNOSIS — D485 Neoplasm of uncertain behavior of skin: Secondary | ICD-10-CM | POA: Diagnosis not present

## 2022-10-04 DIAGNOSIS — L0101 Non-bullous impetigo: Secondary | ICD-10-CM | POA: Diagnosis not present

## 2022-12-05 DIAGNOSIS — Z8614 Personal history of Methicillin resistant Staphylococcus aureus infection: Secondary | ICD-10-CM | POA: Diagnosis not present

## 2022-12-05 DIAGNOSIS — L810 Postinflammatory hyperpigmentation: Secondary | ICD-10-CM | POA: Diagnosis not present

## 2022-12-05 DIAGNOSIS — L0101 Non-bullous impetigo: Secondary | ICD-10-CM | POA: Diagnosis not present

## 2023-01-23 DIAGNOSIS — J0101 Acute recurrent maxillary sinusitis: Secondary | ICD-10-CM | POA: Diagnosis not present

## 2023-06-17 DIAGNOSIS — J040 Acute laryngitis: Secondary | ICD-10-CM | POA: Diagnosis not present

## 2023-06-17 DIAGNOSIS — R5383 Other fatigue: Secondary | ICD-10-CM | POA: Diagnosis not present

## 2023-06-17 DIAGNOSIS — J329 Chronic sinusitis, unspecified: Secondary | ICD-10-CM | POA: Diagnosis not present

## 2023-08-27 DIAGNOSIS — J339 Nasal polyp, unspecified: Secondary | ICD-10-CM | POA: Diagnosis not present

## 2023-08-27 DIAGNOSIS — J329 Chronic sinusitis, unspecified: Secondary | ICD-10-CM | POA: Diagnosis not present

## 2023-09-27 DIAGNOSIS — D225 Melanocytic nevi of trunk: Secondary | ICD-10-CM | POA: Diagnosis not present

## 2023-09-27 DIAGNOSIS — L814 Other melanin hyperpigmentation: Secondary | ICD-10-CM | POA: Diagnosis not present

## 2023-09-27 DIAGNOSIS — L821 Other seborrheic keratosis: Secondary | ICD-10-CM | POA: Diagnosis not present

## 2023-09-27 DIAGNOSIS — L811 Chloasma: Secondary | ICD-10-CM | POA: Diagnosis not present

## 2023-12-06 DIAGNOSIS — Z1231 Encounter for screening mammogram for malignant neoplasm of breast: Secondary | ICD-10-CM | POA: Diagnosis not present

## 2023-12-06 DIAGNOSIS — Z124 Encounter for screening for malignant neoplasm of cervix: Secondary | ICD-10-CM | POA: Diagnosis not present

## 2023-12-06 DIAGNOSIS — Z1331 Encounter for screening for depression: Secondary | ICD-10-CM | POA: Diagnosis not present

## 2023-12-06 DIAGNOSIS — Z01419 Encounter for gynecological examination (general) (routine) without abnormal findings: Secondary | ICD-10-CM | POA: Diagnosis not present

## 2024-01-21 DIAGNOSIS — J101 Influenza due to other identified influenza virus with other respiratory manifestations: Secondary | ICD-10-CM | POA: Diagnosis not present

## 2024-01-21 DIAGNOSIS — J411 Mucopurulent chronic bronchitis: Secondary | ICD-10-CM | POA: Diagnosis not present

## 2024-01-21 DIAGNOSIS — R509 Fever, unspecified: Secondary | ICD-10-CM | POA: Diagnosis not present

## 2024-01-21 DIAGNOSIS — J329 Chronic sinusitis, unspecified: Secondary | ICD-10-CM | POA: Diagnosis not present

## 2024-01-21 DIAGNOSIS — R0981 Nasal congestion: Secondary | ICD-10-CM | POA: Diagnosis not present

## 2024-02-29 DIAGNOSIS — J019 Acute sinusitis, unspecified: Secondary | ICD-10-CM | POA: Diagnosis not present

## 2024-08-17 DIAGNOSIS — R5383 Other fatigue: Secondary | ICD-10-CM | POA: Diagnosis not present

## 2024-08-17 DIAGNOSIS — J029 Acute pharyngitis, unspecified: Secondary | ICD-10-CM | POA: Diagnosis not present

## 2024-08-17 DIAGNOSIS — Z03818 Encounter for observation for suspected exposure to other biological agents ruled out: Secondary | ICD-10-CM | POA: Diagnosis not present

## 2024-08-17 DIAGNOSIS — R0981 Nasal congestion: Secondary | ICD-10-CM | POA: Diagnosis not present

## 2024-08-17 DIAGNOSIS — R52 Pain, unspecified: Secondary | ICD-10-CM | POA: Diagnosis not present

## 2024-09-20 DIAGNOSIS — R0981 Nasal congestion: Secondary | ICD-10-CM | POA: Diagnosis not present

## 2024-09-20 DIAGNOSIS — R059 Cough, unspecified: Secondary | ICD-10-CM | POA: Diagnosis not present

## 2024-09-26 DIAGNOSIS — Z0000 Encounter for general adult medical examination without abnormal findings: Secondary | ICD-10-CM | POA: Diagnosis not present

## 2024-09-26 DIAGNOSIS — J329 Chronic sinusitis, unspecified: Secondary | ICD-10-CM | POA: Diagnosis not present

## 2024-09-26 DIAGNOSIS — D509 Iron deficiency anemia, unspecified: Secondary | ICD-10-CM | POA: Diagnosis not present

## 2024-10-30 DIAGNOSIS — D1801 Hemangioma of skin and subcutaneous tissue: Secondary | ICD-10-CM | POA: Diagnosis not present

## 2024-10-30 DIAGNOSIS — L218 Other seborrheic dermatitis: Secondary | ICD-10-CM | POA: Diagnosis not present

## 2024-10-30 DIAGNOSIS — L821 Other seborrheic keratosis: Secondary | ICD-10-CM | POA: Diagnosis not present

## 2024-10-30 DIAGNOSIS — L814 Other melanin hyperpigmentation: Secondary | ICD-10-CM | POA: Diagnosis not present
